# Patient Record
Sex: Female | Born: 1975 | Race: White | Hispanic: No | Marital: Married | Smoking: Never smoker
Health system: Southern US, Community
[De-identification: ages and names within clinical notes are randomized; demographics above are authoritative.]

## PROBLEM LIST (undated history)

## (undated) DIAGNOSIS — J45909 Unspecified asthma, uncomplicated: Secondary | ICD-10-CM

## (undated) DIAGNOSIS — E039 Hypothyroidism, unspecified: Secondary | ICD-10-CM

## (undated) DIAGNOSIS — J309 Allergic rhinitis, unspecified: Secondary | ICD-10-CM

## (undated) DIAGNOSIS — H04309 Unspecified dacryocystitis of unspecified lacrimal passage: Secondary | ICD-10-CM

## (undated) HISTORY — PX: NO PAST SURGERIES: SHX2092

## (undated) HISTORY — DX: Unspecified dacryocystitis of unspecified lacrimal passage: H04.309

## (undated) HISTORY — DX: Hypothyroidism, unspecified: E03.9

## (undated) HISTORY — DX: Allergic rhinitis, unspecified: J30.9

## (undated) HISTORY — DX: Unspecified asthma, uncomplicated: J45.909

---

## 2008-10-19 ENCOUNTER — Inpatient Hospital Stay (HOSPITAL_COMMUNITY): Admission: AD | Admit: 2008-10-19 | Discharge: 2008-10-21 | Payer: Self-pay | Admitting: Obstetrics & Gynecology

## 2008-10-19 ENCOUNTER — Encounter: Payer: Self-pay | Admitting: Internal Medicine

## 2008-10-19 LAB — CONVERTED CEMR LAB
HCT: 40.4 %
Hemoglobin: 13.9 g/dL
MCV: 94.3 fL
Platelets: 234 10*3/uL
RBC: 4.28 M/uL
RDW: 12.8 %
WBC: 10 10*3/uL

## 2008-10-20 ENCOUNTER — Encounter: Payer: Self-pay | Admitting: Internal Medicine

## 2008-10-20 LAB — CONVERTED CEMR LAB
HCT: 35.7 %
Hemoglobin: 12.3 g/dL
MCV: 95 fL
Platelets: 237 10*3/uL
RBC: 3.76 M/uL
RDW: 13 %
WBC: 17 10*3/uL

## 2008-11-19 LAB — CONVERTED CEMR LAB

## 2009-03-27 ENCOUNTER — Emergency Department (HOSPITAL_COMMUNITY): Admission: EM | Admit: 2009-03-27 | Discharge: 2009-03-27 | Payer: Self-pay | Admitting: Family Medicine

## 2009-04-16 ENCOUNTER — Encounter: Payer: Self-pay | Admitting: Internal Medicine

## 2009-06-12 ENCOUNTER — Ambulatory Visit: Payer: Self-pay | Admitting: Internal Medicine

## 2009-06-12 DIAGNOSIS — J309 Allergic rhinitis, unspecified: Secondary | ICD-10-CM | POA: Insufficient documentation

## 2009-06-12 DIAGNOSIS — E039 Hypothyroidism, unspecified: Secondary | ICD-10-CM | POA: Insufficient documentation

## 2009-06-12 DIAGNOSIS — J45909 Unspecified asthma, uncomplicated: Secondary | ICD-10-CM

## 2009-06-13 LAB — CONVERTED CEMR LAB: TSH: 0.19 microintl units/mL — ABNORMAL LOW (ref 0.35–5.50)

## 2009-07-21 ENCOUNTER — Ambulatory Visit: Payer: Self-pay | Admitting: Internal Medicine

## 2009-07-21 DIAGNOSIS — H04309 Unspecified dacryocystitis of unspecified lacrimal passage: Secondary | ICD-10-CM | POA: Insufficient documentation

## 2009-07-21 DIAGNOSIS — L255 Unspecified contact dermatitis due to plants, except food: Secondary | ICD-10-CM

## 2009-07-21 LAB — CONVERTED CEMR LAB: TSH: 6.93 u[IU]/mL — ABNORMAL HIGH

## 2009-09-08 ENCOUNTER — Ambulatory Visit: Payer: Self-pay | Admitting: Endocrinology

## 2009-09-08 LAB — CONVERTED CEMR LAB: TSH: 4.51 microintl units/mL (ref 0.35–5.50)

## 2009-11-02 ENCOUNTER — Telehealth: Payer: Self-pay | Admitting: Internal Medicine

## 2009-11-30 ENCOUNTER — Encounter: Payer: Self-pay | Admitting: Endocrinology

## 2009-12-02 ENCOUNTER — Telehealth: Payer: Self-pay | Admitting: Endocrinology

## 2009-12-02 ENCOUNTER — Encounter: Payer: Self-pay | Admitting: Endocrinology

## 2010-02-09 ENCOUNTER — Telehealth: Payer: Self-pay | Admitting: Internal Medicine

## 2010-03-21 NOTE — L&D Delivery Note (Signed)
Pt had an uneventful 1st stage.  When she began pushing the nursing staff believed that they palpated a mouth.  The position was confirmed as mentum anterior.  The risk and complication of this position was discussed with the patient and her husband.  She pushed for another 20 minutes.  FHTs remained reactive.  She had a SVD over a 2nd degree MLE.  Handed to NICU team. Apgars 9 and 9.  Placenta S/I 3vc.  Baby to NBN.  EBL-400cc. MLE was closed with 2-0 Vicryl.

## 2010-04-09 ENCOUNTER — Encounter: Payer: Self-pay | Admitting: Internal Medicine

## 2010-04-09 ENCOUNTER — Other Ambulatory Visit: Payer: Self-pay | Admitting: Internal Medicine

## 2010-04-09 ENCOUNTER — Ambulatory Visit
Admission: RE | Admit: 2010-04-09 | Discharge: 2010-04-09 | Payer: Self-pay | Source: Home / Self Care | Attending: Internal Medicine | Admitting: Internal Medicine

## 2010-04-09 DIAGNOSIS — R109 Unspecified abdominal pain: Secondary | ICD-10-CM | POA: Insufficient documentation

## 2010-04-09 LAB — CONVERTED CEMR LAB
Bilirubin Urine: NEGATIVE
Specific Gravity, Urine: <1.005
Urobilinogen, UA: 0.2
WBC Urine, dipstick: NEGATIVE
pH: 5

## 2010-04-09 LAB — BASIC METABOLIC PANEL
BUN: 10 mg/dL (ref 6–23)
CO2: 29 mEq/L (ref 19–32)
Calcium: 9.1 mg/dL (ref 8.4–10.5)
Chloride: 106 mEq/L (ref 96–112)
Creatinine, Ser: 0.7 mg/dL (ref 0.4–1.2)
GFR: 110.79 mL/min (ref >60.00)
Glucose, Bld: 80 mg/dL (ref 70–99)
Potassium: 4 mEq/L (ref 3.5–5.1)
Sodium: 142 mEq/L (ref 135–145)

## 2010-04-09 LAB — CBC WITH DIFFERENTIAL/PLATELET
Basophils Absolute: 0 10*3/uL (ref 0.0–0.1)
Basophils Relative: 0.4 % (ref 0.0–3.0)
Eosinophils Absolute: 0.1 10*3/uL (ref 0.0–0.7)
Eosinophils Relative: 1.8 % (ref 0.0–5.0)
HCT: 39.7 % (ref 36.0–46.0)
Hemoglobin: 13.7 g/dL (ref 12.0–15.0)
Lymphocytes Relative: 21.7 % (ref 12.0–46.0)
Lymphs Abs: 1.7 10*3/uL (ref 0.7–4.0)
MCHC: 34.6 g/dL (ref 30.0–36.0)
MCV: 91.5 fl (ref 78.0–100.0)
Monocytes Absolute: 0.6 10*3/uL (ref 0.1–1.0)
Monocytes Relative: 8.3 % (ref 3.0–12.0)
Neutro Abs: 5.2 10*3/uL (ref 1.4–7.7)
Neutrophils Relative %: 67.8 % (ref 43.0–77.0)
Platelets: 238 10*3/uL (ref 150.0–400.0)
RBC: 4.34 Mil/uL (ref 3.87–5.11)
RDW: 12.9 % (ref 11.5–14.6)
WBC: 7.7 10*3/uL (ref 4.5–10.5)

## 2010-04-09 LAB — TSH: TSH: 5.77 u[IU]/mL — ABNORMAL HIGH (ref 0.35–5.50)

## 2010-04-12 DIAGNOSIS — R109 Unspecified abdominal pain: Secondary | ICD-10-CM | POA: Insufficient documentation

## 2010-04-13 ENCOUNTER — Ambulatory Visit: Payer: Self-pay | Admitting: Internal Medicine

## 2010-04-13 ENCOUNTER — Encounter: Payer: Self-pay | Admitting: Internal Medicine

## 2010-04-14 ENCOUNTER — Telehealth: Payer: Self-pay | Admitting: Internal Medicine

## 2010-04-20 NOTE — Miscellaneous (Signed)
  Clinical Lists Changes  Medications: Removed medication of LEVOTHYROXINE SODIUM 25 MCG TABS (LEVOTHYROXINE SODIUM) 1 by mouth once daily except 2 tabs on Mon, Wed and Fri Added new medication of LEVOTHYROXINE SODIUM 50 MCG TABS (LEVOTHYROXINE SODIUM) 1 tab once daily - Signed Rx of LEVOTHYROXINE SODIUM 50 MCG TABS (LEVOTHYROXINE SODIUM) 1 tab once daily;  #30 x 5;  Signed;  Entered by: Minus Breeding MD;  Authorized by: Minus Breeding MD;  Method used: Electronically to [facility] # [address], [place], [place]  [address], Ph: [phone], Fax: [phone]    Prescriptions: LEVOTHYROXINE SODIUM 50 MCG TABS (LEVOTHYROXINE SODIUM) 1 tab once daily  #30 x 5   Entered and Authorized by:   Minus Breeding MD   Signed by:   Minus Breeding MD on 12/02/2009   Method used:   Electronically to        [facility] # [address] (retail)       [address]       [place], [place]  [address]       Ph: [phone]       Fax: [phone]   RxID:   [id number]

## 2010-04-20 NOTE — Assessment & Plan Note (Signed)
Summary: NEW / [facility] / #/ CD   Vital Signs:  Patient profile:   35 year old female Height:      70.5 inches (179.07 cm) Weight:      145.8 pounds (66.27 kg) BMI:     20.70 O2 Sat:      97 % on Room air Temp:     98.5 degrees F (36.94 degrees C) oral Pulse rate:   77 / minute BP sitting:   112 / 70  (left arm) Cuff size:   regular  Vitals Entered By: Orlan Leavens (June 12, 2009 3:18 PM)  O2 Flow:  Room air CC: New patient Is Patient Diabetic? No Pain Assessment Patient in pain? no        Primary Care Provider:  Newt Lukes MD  CC:  New patient.  History of Present Illness: new pt to me and our practice - here to est care -   1) hypothyroidism - reports compliance with ongoing medical treatment and no changes in medication dose or frequency. denies adverse side effects related to current therapy.   2) seasonal allg - "severe" since living in [place] last 3 years - takes antihistamine daily - occassional hives thoughhas been no skin allg in past few months  Preventive Screening-Counseling & Management  Alcohol-Tobacco     Alcohol drinks/day: <1     Smoking Status: quit  Caffeine-Diet-Exercise     Diet Counseling: not indicated; diet is assessed to be healthy     Does Patient Exercise: yes     Exercise Counseling: not indicated; exercise is adequate     Depression Counseling: not indicated; screening negative for depression  Clinical Review Panels:  Prevention   Last Pap Smear:  Interpretation/Result :Negative for intraepithelial Lesion or Malignancy.    (11/19/2008)  Immunizations   Last Tetanus Booster:  Historical (03/21/2009)  CBC   WBC:  17.0 (10/20/2008)   RBC:  3.76 (10/20/2008)   Hgb:  12.3 (10/20/2008)   Hct:  35.7 (10/20/2008)   Platelets:  237 (10/20/2008)   MCV  95.0 (10/20/2008)   RDW  13.0 (10/20/2008)   Current Medications (verified): 1)  Levothyroxine Sodium 50 Mcg Tabs (Levothyroxine Sodium) .... Take 1 Po Qd 2)  Claritin-D 24  Hour 10-240 Mg Xr24h-Tab (Loratadine-Pseudoephedrine) .... Take 1 By Mouth Qd 3)  Ortho Micronor 0.35 Mg Tabs (Norethindrone) .... 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    Physical Exam  General:  alert, well-developed, well-nourished, and cooperative to examination.   newborn dtr here Eyes:  vision grossly intact; pupils equal, round and reactive to light.  conjunctiva and lids normal.    Neck:  supple, full ROM, no masses, no thyromegaly; no thyroid nodules or tenderness. no JVD or carotid bruits.   Lungs:  normal respiratory effort, no intercostal retractions or use of accessory muscles; normal breath sounds bilaterally - no crackles and no wheezes.  Heart:  normal rate, regular rhythm, no murmur, and no rub. BLE without edema.    Impression & Recommendations:  Problem # 1:  HYPOTHYROIDISM (ICD-244.9)  Her updated medication list for this problem includes:    Levothyroxine Sodium 50 Mcg Tabs (Levothyroxine sodium) .Marland Kitchen... Take 1 po qd  Orders: TLB-TSH (Thyroid Stimulating Hormone) (84443-TSH)  Problem # 2:  ALLERGIC RHINITIS (ICD-477.9)  Her updated medication list for this problem includes:    Claritin 10 Mg Tabs (Loratadine) .Marland Kitchen... 1 by mouth once daily  Complete Medication List: 1)   Levothyroxine Sodium 50 Mcg Tabs (Levothyroxine sodium) .... Take 1 po qd 2)  Claritin 10 Mg Tabs (Loratadine) .Marland Kitchen.. 1 by mouth once daily 3)  Ortho Micronor 0.35 Mg Tabs (Norethindrone) .... Once daily  Patient Instructions: 1)  it was good to see you today. 2)  test(s) ordered today - your results will be posted on the phone tree for review in 48-72 hours from the time of test completion; call [phone] and enter your 9 digit MRN (listed above on this page, just below your name); if any changes need to be made or there are abnormal results, you will be contacted directly.  3)  Refills on levothyroixine will be sent to your pharmacy as discussed if labs are normal - 4)  Please schedule a follow-up appointment in 6-12 months, sooner if problems.    Pap Smear  Procedure date:  11/19/2008  Findings:      Interpretation/Result :Negative for intraepithelial Lesion or Malignancy.        Immunization History:  Tetanus/Td Immunization History:    Tetanus/Td:  historical (03/21/2009)

## 2010-04-20 NOTE — Assessment & Plan Note (Signed)
Summary: NEW ENDO PT-FLAG/DD--HYPOTHY-STC   Vital Signs:  Patient profile:   35 year old female Height:      70.5 inches (179.07 cm) Weight:      145 pounds (65.91 kg) BMI:     20.59 O2 Sat:      92 % on Room air Temp:     97.9 degrees F (36.61 degrees C) oral Pulse rate:   64 / minute Pulse rhythm:   regular BP sitting:   108 / 72  (left arm) Cuff size:   regular  Vitals Entered By: Brenton Grills MA (September 08, 2009 9:53 AM)  O2 Flow:  Room air CC: New pt endo/aj   Primary Provider:  Newt Lukes MD  CC:  New pt endo/aj.  History of Present Illness: pt is now 11 months postpartum.   she was dx'ed with hypothyroidism in 2006.  she had persistent satiety, fatigue, weight gain.  she started synthroid, with improvement in her sxs.  3 mos ago, her synthroid was reduced to 25 micrograms/day.  then, last month, it was increased back to: 25 micrograms/day, except for 2x25 micrograms/day, three times weekly, x last 7 weeks.   she now has few mos of slight intermittent bloating in the abdomen, and associated fatigue.  she has resumed breast-feeding recently.    Current Medications (verified): 1)  Levothyroxine Sodium 25 Mcg Tabs (Levothyroxine Sodium) .Marland Kitchen.. 1 By Mouth Once Daily Except 2 Tabs On Mon, Wed and Fri 2)  Claritin 10 Mg Tabs (Loratadine) .... Take 2 By Mouth Once Daily  Allergies (verified): No Known Drug Allergies  Past History:  Past Medical History: Last updated: 07/21/2009 Allergic rhinitis Asthma hypothyroid migraines  MD rooster: gyn -Kaplan ([place])    Family History: Reviewed history from 06/12/2009 and no changes required. Family History of Alcoholism/Addiction (other relative) Family History of Arthritis (grandparent) Family History Diabetes 1st degree relative parent, grandparent) Family History High cholesterol (parent, other relative) mother had uncertain type of thyroid problem as a teenager (resolved)  Social History: Reviewed history from  06/12/2009 and no changes required. 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  dry skin, syncope.  she gained 60 lbs with the pregnancy, and since then has re-lost 50 of that.  menses have just resumed.     Physical Exam  General:  normal appearance.   Head:  head: no deformity eyes: no periorbital swelling, no proptosis external nose and ears are normal mouth: no lesion seen Neck:  no goiter Lungs:  Clear to auscultation bilaterally. Normal respiratory effort.  Heart:  Regular rate and rhythm without murmurs or gallops noted. Normal S1,S2.   Abdomen:  abdomen is soft, nontender.  no hepatosplenomegaly.   not distended.  no hernia  Msk:  muscle bulk and strength are grossly normal.  no obvious joint swelling.  gait is normal and steady  Extremities:  no deformity no edema Neurologic:  cn 2-12 grossly intact.   readily moves all 4's.   sensation is intact to touch on the feet. Skin:  normal texture and temp.  no rash.  not diaphoretic  Cervical Nodes:  No significant adenopathy.  Psych:  Alert and cooperative; normal mood and affect; normal attention span and concentration.   Additional Exam:  today: FastTSH  4.51 uIU/mL                 0.35-5.50    Impression & Recommendations:  Problem # 1:  HYPOTHYROIDISM (ICD-244.9) well-replaced  Problem # 2:  postpartum state this can affect thyroid hormone requirement  Other Orders: TLB-TSH (Thyroid Stimulating Hormone) (19147-WGN) Consultation Level III (56213)  Patient Instructions: 1)  cc dr Dareen Piano 2)  blood tests are being ordered for you today.  please call [phone] to hear your test results. 3)  Please schedule a follow-up appointment in 6 months. 4)  if you feel significantly different  in the meantime, call sooner, and dr Felicity Coyer or i would be happy to recheck the blood test sooner.

## 2010-04-20 NOTE — Progress Notes (Signed)
Summary: synthroid  Phone Note Refill Request Message from:  Fax from Pharmacy on November 02, 2009 9:30 AM  Refills Requested: Medication #1:  LEVOTHYROXINE SODIUM 25 MCG TABS 1 by mouth once daily except 2 tabs on Mon  Method Requested: Electronic Initial call taken by: Orlan Leavens RMA,  November 02, 2009 9:30 AM    Prescriptions: LEVOTHYROXINE SODIUM 25 MCG TABS (LEVOTHYROXINE SODIUM) 1 by mouth once daily except 2 tabs on Mon, Wed and Fri  #50 x 10   Entered by:   Orlan Leavens RMA   Authorized by:   Newt Lukes MD   Signed by:   Orlan Leavens RMA on 11/02/2009   Method used:   Electronically to        [facility] # [id number] (retail)       [address]       [place], [place]  [address]       Ph: [phone]       Fax: [phone]   RxID:   [id number]

## 2010-04-20 NOTE — Progress Notes (Signed)
Summary: synthroid dosage increase  Phone Note Outgoing Call Call back at [facility] Phone [phone]   Call placed by: Brenton Grills MA,  December 02, 2009 10:41 AM Call placed to: Patient Summary of Call: Pt's Levothyroxine medication increased by MD. Left message on voicemail informing pt of increase and that new rx was sent to her pharmacy. Initial call taken by: Brenton Grills MA,  December 02, 2009 10:42 AM

## 2010-04-20 NOTE — Assessment & Plan Note (Signed)
Summary: follow up-lb   Vital Signs:  Patient profile:   35 year old female Height:      70.5 inches (179.07 cm) Weight:      145.12 pounds (65.96 kg) O2 Sat:      97 % on Room air Temp:     98.1 degrees F (36.72 degrees C) oral Pulse rate:   67 / minute BP sitting:   98 / 72  (right arm)  Vitals Entered By: Orlan Leavens (Jul 21, 2009 10:32 AM)  O2 Flow:  Room air CC: follow-up visit Is Patient Diabetic? No Pain Assessment Patient in pain? no        Primary Care Provider:  Newt Lukes MD  CC:  follow-up visit.  History of Present Illness: here for f/u  hypothyroid - dose dec 3/26 due to low TSH - denies weight/skin/bowel change, less fatigue, no depression  seasonal allg - has caused inflammation and redness on lower inner eyelid -  no fever or vision changes -  symptoms present>1 week but yesterday began to "drain" and now feels much better- using claritin daily for nasal allg, no eye drops -  ?poison ivy - husband just got steroid shot for same - onset after exposure outdoors with yard work 72h ago -  affects left lateral thigh and flank - itching "bad" but not spreading - not tender - poorly tol of steroids   Current Medications (verified): 1)  Levothyroxine Sodium 25 Mcg Tabs (Levothyroxine Sodium) .Marland Kitchen.. 1 By Mouth Once Daily 2)  Claritin 10 Mg Tabs (Loratadine) .... Take 2 By Mouth Once Daily  Allergies (verified): No Known Drug Allergies  Past History:  Past Medical History: Allergic rhinitis Asthma hypothyroid migraines  MD rooster: gyn -Kaplan ([place])    Review of Systems  The patient denies anorexia, fever, weight loss, and chest pain.    Physical Exam  General:  alert, well-developed, well-nourished, and cooperative to examination.   newborn dtr and spouse here Eyes:  crusting over surface and min inflammation of right tearduct region - unable to express any purulence - no erythema or edema of eyelids/lashes - vision grossly intact;  pupils equal, round and reactive to light.  conjunctiva and lids normal.    Lungs:  normal respiratory effort, no intercostal retractions or use of accessory muscles; normal breath sounds bilaterally - no crackles and no wheezes.    Heart:  normal rate, regular rhythm, no murmur, and no rub. BLE without edema.  Skin:  linear scratches with irritation c/w posion ivy exposure on left thigh and left flank   Impression & Recommendations:  Problem # 1:  HYPOTHYROIDISM (ICD-244.9)  dose dec 3/26 due to suppressed TSH - recheck now - adjust as needed Her updated medication list for this problem includes:    Levothyroxine Sodium 25 Mcg Tabs (Levothyroxine sodium) .Marland Kitchen... 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 Take 2 by mouth once daily  Discussed avoidance of triggers and symptomatic treatment.   Problem # 3:  DACRYOCYSTITIS (ICD-375.30) improved s/p psontaneous drainage in last 24h - instructed on warm compress and to apply gentle outward pressur e- no signs infx or need for abx - cont allg control   Complete Medication List: 1)  Levothyroxine Sodium 25 Mcg Tabs (Levothyroxine sodium) .Marland Kitchen.. 1 by mouth once daily 2)  Claritin 10 Mg Tabs (Loratadine) .... Take 2 by mouth once daily  Patient Instructions: 1)  it was good to see you today. 2)  test(s) ordered today - your results will be called to you.  3)  use steroid cream (like hydrocortisone) on body affected by poison ivy scratches as needed -  4)  keep warm compress on right eye as needed to help with drainage - if redness, swelling or fever, please call 5)  Please schedule a follow-up appointment in 4-6 months to monitor thyroid issues, sooner if problems.

## 2010-04-20 NOTE — Progress Notes (Signed)
Summary: Rx req  Phone Note Call from Patient Call back at Home Phone [phone]   Caller: Patient Summary of Call: Pt called stating she has been experiencing back spasms for a few days now. Pt is requesting Rx for muscle relaxer, please advise. Initial call taken by: Margaret Pyle, CMA,  February 09, 2010 10:43 AM  Follow-up for Phone Call        ok for flexeril as needed - done per emr Follow-up by: Corwin Levins MD,  February 10, 2010 1:15 PM  Additional Follow-up for Phone Call Additional follow up Details #1::        Pt advised via VM that requested Rx is at pharmacy, pt told to callback if needed. Additional Follow-up by: Margaret Pyle, CMA,  February 10, 2010 1:22 PM    New/Updated Medications: FLEXERIL 5 MG TABS (CYCLOBENZAPRINE HCL) 1 by mouth three times a day as needed Prescriptions: FLEXERIL 5 MG TABS (CYCLOBENZAPRINE HCL) 1 by mouth three times a day as needed  #40 x 0   Entered by:   Corwin Levins MD   Authorized by:   Newt Lukes MD   Signed by:   Corwin Levins MD on 02/10/2010   Method used:   Electronically to        [facility] # [address]* (retail)       [address]       [place], [place]  [address]       Ph: [phone]       Fax: [phone]   RxID:   [id number]

## 2010-04-22 NOTE — Progress Notes (Signed)
Summary: RESULTS CT  Phone Note Call from Patient Call back at Home Phone [phone]   Summary of Call: Pt had CT yesterday am and left vm req results.  Initial call taken by: Lamar Sprinkles, CMA,  April 14, 2010 2:42 PM  Follow-up for Phone Call        i have not seen results - our system or hosp EMR - please help me track this down - thanks! Follow-up by: Newt Lukes MD,  April 14, 2010 3:52 PM  Additional Follow-up for Phone Call Additional follow up Details #1::        Rose at CT faxed results, forwarded to VAL for review. Margaret Pyle, CMA  April 14, 2010 4:33 PM   Per VAL "tiny kidney stone on rt, should no be causing pain, no ovary or colon or abnormality to explain pain sxs-  f/u with GYN as planned for eval or ROV here".  left message on machine for pt to return my call. Additional Follow-up by: Margaret Pyle, CMA,  April 14, 2010 4:37 PM    Additional Follow-up for Phone Call Additional follow up Details #2::    Pt advised and will follow up with GYN this week. Follow-up by: Margaret Pyle, CMA,  April 15, 2010 8:49 AM

## 2010-04-22 NOTE — Assessment & Plan Note (Signed)
Summary: ABD PAIN /NWS   Vital Signs:  Patient profile:   35 year old female Height:      70.5 inches (179.07 cm) Weight:      145 pounds (65.91 kg) O2 Sat:      97 % on Room air Temp:     98.5 degrees F (36.94 degrees C) oral Pulse rate:   61 / minute BP sitting:   98 / 78  (left arm) Cuff size:   regular  Vitals Entered By: Orlan Leavens RMA (April 09, 2010 4:13 PM)  O2 Flow:  Room air CC: abdominal pain, Abdominal pain Is Patient Diabetic? No Pain Assessment Patient in pain? yes     Location: abdomen   Primary Care Provider:  Newt Lukes MD  CC:  abdominal pain and Abdominal pain.  History of Present Illness:  Abdominal Pain      This is a 35 year old woman who presents with Abdominal pain.  The symptoms began 2 weeks ago.  On a scale of 1 to 10, the intensity is described as a 5.  no history of this type of abd pain. "bad" period last week but continued suprapubic cramping spells.  The patient denies nausea, vomiting, diarrhea, constipation, and anorexia.  The location of the pain is suprapubic.  The pain is described as intermittent, sharp, and cramping in quality.  Associated symptoms include dysuria and dark urine.  The patient denies the following symptoms: fever, weight loss, chest pain, and missed menstrual period.  The pain is worse with food.  The pain is better with rest.    hypothyroid - dx 2008 - reports compliance with ongoing medical treatment and no changes in medication dose or frequency. denies adverse side effects related to current therapy.   Clinical Review Panels:  CBC   WBC:  17.0 (10/20/2008)   RBC:  3.76 (10/20/2008)   Hgb:  12.3 (10/20/2008)   Hct:  35.7 (10/20/2008)   Platelets:  237 (10/20/2008)   MCV  95.0 (10/20/2008)   RDW  13.0 (10/20/2008)   Current Medications (verified): 1)  Levothyroxine Sodium 50 Mcg Tabs (Levothyroxine Sodium) .Marland Kitchen.. 1 Tab Once Daily  Allergies (verified): No Known Drug Allergies  Past History:  Past  Medical History: Allergic rhinitis Asthma hypothyroid  migraines  MD roster: gyn -Kaplan ([facility])   endo - ellison  Review of Systems  The patient denies melena, hematochezia, severe indigestion/heartburn, hematuria, incontinence, genital sores, suspicious skin lesions, and depression.    Physical Exam  General:  alert, well-developed, well-nourished, and cooperative to examination.   Lungs:  normal respiratory effort, no intercostal retractions or use of accessory muscles; normal breath sounds bilaterally - no crackles and no wheezes.    Heart:  normal rate, regular rhythm, no murmur, and no rub. BLE without edema.  Abdomen:  soft, non-tender, normal bowel sounds, no distention; no masses and no appreciable hepatomegaly or splenomegaly.     Impression & Recommendations:  Problem # 1:  SUPRAPUBIC PAIN (ICD-789.09)  probable cystitis pain -hx and exam otherwise benign  Udip unremarkable but tc emperic for UTI and send for Ucx - also check labs The following medications were removed from the medication list:    Flexeril 5 Mg Tabs (Cyclobenzaprine hcl) .Marland Kitchen... 1 by mouth three times a day as needed  Orders: UA Dipstick w/o Micro (manual) (10932) T-Culture, Urine ([id number]) TLB-CBC Platelet - w/Differential (85025-CBCD) TLB-BMP (Basic Metabolic Panel-BMET) (80048-METABOL) Prescription Created Electronically [phone])  Discussed use of medications, application of heat  or cold, and exercises.   Problem # 2:  HYPOTHYROIDISM (ICD-244.9)  Her updated medication list for this problem includes:    Levothyroxine Sodium 50 Mcg Tabs (Levothyroxine sodium) .Marland Kitchen... 1 tab once daily   dose dec 3/26 due to suppressed TSH - recheck now - adjust as needed  Labs Reviewed: TSH: 4.51 (09/08/2009)     Orders: TLB-TSH (Thyroid Stimulating Hormone) (84443-TSH)  Complete Medication List: 1)  Levothyroxine Sodium 50 Mcg Tabs (Levothyroxine sodium) .Marland Kitchen.. 1 tab once daily 2)  Ciprofloxacin Hcl  500 Mg Tabs (Ciprofloxacin hcl) .Marland Kitchen.. 1 by mouth two times a day x 5 days  Patient Instructions: 1)  it was good to see you today. 2)  test(s) ordered today - your results will be called to you after review in 48-72 hours from the time of test completion; if any changes need to be made or there are abnormal results, you will be notified at that time 3)  cipro for possible urinary infection - your prescription has been electronically submitted to your pharmacy. Please take as directed. Contact our office if you believe you're having problems with the medication(s).  4)  Get plenty of rest, drink lots of clear liquids, and use Tylenol or Ibuprofen for fever and comfort. Return in 7-10 days if you're not better:sooner if you're feeling worse. Prescriptions: CIPROFLOXACIN HCL 500 MG TABS (CIPROFLOXACIN HCL) 1 by mouth two times a day x 5 days  #10 x 0   Entered and Authorized by:   Newt Lukes MD   Signed by:   Newt Lukes MD on 04/09/2010   Method used:   Electronically to        [facility] # [phone]* (retail)       [address]       [place], [place]  [address]       Ph: [phone]       Fax: [phone]   RxID:   [id number]    Orders Added: 1)  UA Dipstick w/o Micro (manual) [81002] 2)  T-Culture, Urine [[id number]] 3)  TLB-CBC Platelet - w/Differential [85025-CBCD] 4)  TLB-BMP (Basic Metabolic Panel-BMET) [80048-METABOL] 5)  TLB-TSH (Thyroid Stimulating Hormone) [84443-TSH] 6)  Est. Patient Level IV [16606] 7)  Prescription Created Electronically [phone]    Laboratory Results   Urine Tests    Routine Urinalysis   Color: lt. yellow Appearance: Clear Glucose: negative   (Normal Range: Negative) Bilirubin: negative   (Normal Range: Negative) Ketone: negative   (Normal Range: Negative) Spec. Gravity: <1.005   (Normal Range: 1.003-1.035) Blood: trace-intact   (Normal Range: Negative) pH: 5.0   (Normal Range: 5.0-8.0) Protein: negative    (Normal Range: Negative) Urobilinogen: 0.2   (Normal Range: 0-1) Nitrite: negative   (Normal Range: Negative) Leukocyte Esterace: negative   (Normal Range: Negative)

## 2010-04-23 ENCOUNTER — Telehealth: Payer: Self-pay | Admitting: Internal Medicine

## 2010-05-06 NOTE — Progress Notes (Signed)
Summary: tsh labs resutls  Phone Note Call from Patient Call back at Home Phone [phone]   Caller: [phone] Call For: Dr Felicity Coyer Summary of Call: Pt requesting TSH lab results. Pt states she has left a few messages in medical records but has not rec'd a call back Initial call taken by: Verdell Face,  April 23, 2010 2:42 PM  Follow-up for Phone Call        pt was notified on 1/23 (see append on lab note):   needs to increase thyroid a little due to high TSH - take daily except on sunday  -may send erx for thyroid meds as needed  other labs ok - Newt Lukes MD  April 11, 2010 1:30 PM   Called pt concerning labs & urine test. Pt would like new rx's sent to pharmacy, but pt states she is actually worse today. Pain has not gone away. Having to leave work this morning, she states she has develop diarrhea now, pain is worse. Want to know what else she can do. Pls advise....04/12/10@12 :11pm/LMB  erx were done - please review above with pt again- thanks Follow-up by: Newt Lukes MD,  April 23, 2010 4:08 PM  Additional Follow-up for Phone Call Additional follow up Details #1::        Pt called requesting to have values of TSH for the last year, pt is going to see and Endocrinologist, I called pt and advised of values and dose changes. Additional Follow-up by: Margaret Pyle, CMA,  April 26, 2010 11:02 AM

## 2010-06-06 LAB — POCT URINALYSIS DIP (DEVICE)
Protein, ur: NEGATIVE mg/dL
Urobilinogen, UA: 0.2 mg/dL (ref 0.0–1.0)
pH: 6.5 (ref 5.0–8.0)

## 2010-06-06 LAB — POCT PREGNANCY, URINE: Preg Test, Ur: NEGATIVE

## 2010-06-21 LAB — HEPATITIS B SURFACE ANTIGEN: Hepatitis B Surface Ag: NEGATIVE

## 2010-06-21 LAB — ANTIBODY SCREEN: Antibody Screen: NEGATIVE

## 2010-06-21 LAB — ABO/RH

## 2010-06-21 LAB — HIV ANTIBODY (ROUTINE TESTING W REFLEX): HIV: NONREACTIVE

## 2010-06-21 LAB — RUBELLA ANTIBODY, IGM: Rubella: IMMUNE

## 2010-06-26 LAB — CBC
HCT: 35.7 % — ABNORMAL LOW (ref 36.0–46.0)
Hemoglobin: 12.3 g/dL (ref 12.0–15.0)
Hemoglobin: 13.9 g/dL (ref 12.0–15.0)
MCHC: 34.5 g/dL (ref 30.0–36.0)
MCV: 95 fL (ref 78.0–100.0)
RBC: 3.76 MIL/uL — ABNORMAL LOW (ref 3.87–5.11)
RBC: 4.28 MIL/uL (ref 3.87–5.11)
WBC: 17 10*3/uL — ABNORMAL HIGH (ref 4.0–10.5)

## 2010-09-17 ENCOUNTER — Other Ambulatory Visit: Payer: Self-pay | Admitting: Internal Medicine

## 2010-09-20 ENCOUNTER — Encounter: Payer: Self-pay | Admitting: Internal Medicine

## 2010-10-03 ENCOUNTER — Encounter (HOSPITAL_COMMUNITY): Payer: Self-pay

## 2010-10-03 ENCOUNTER — Inpatient Hospital Stay (HOSPITAL_COMMUNITY)
Admission: AD | Admit: 2010-10-03 | Discharge: 2010-10-03 | Disposition: A | Discharge status: Home / Self Care | Payer: 59 | Source: Ambulatory Visit | Attending: Obstetrics and Gynecology | Admitting: Obstetrics and Gynecology

## 2010-10-03 ENCOUNTER — Inpatient Hospital Stay (HOSPITAL_COMMUNITY): Payer: 59

## 2010-10-03 DIAGNOSIS — O209 Hemorrhage in early pregnancy, unspecified: Secondary | ICD-10-CM | POA: Insufficient documentation

## 2010-10-03 DIAGNOSIS — O2390 Unspecified genitourinary tract infection in pregnancy, unspecified trimester: Secondary | ICD-10-CM

## 2010-10-03 DIAGNOSIS — B3731 Acute candidiasis of vulva and vagina: Secondary | ICD-10-CM | POA: Insufficient documentation

## 2010-10-03 DIAGNOSIS — B373 Candidiasis of vulva and vagina: Secondary | ICD-10-CM

## 2010-10-03 LAB — URINALYSIS, ROUTINE W REFLEX MICROSCOPIC
Bilirubin Urine: NEGATIVE
Glucose, UA: NEGATIVE mg/dL
Ketones, ur: NEGATIVE mg/dL
pH: 7 (ref 5.0–8.0)

## 2010-10-03 LAB — URINE MICROSCOPIC-ADD ON

## 2010-10-03 LAB — WET PREP, GENITAL
Clue Cells Wet Prep HPF POC: NONE SEEN
Trich, Wet Prep: NONE SEEN

## 2010-10-03 NOTE — Progress Notes (Signed)
EFM removed, to u/s.

## 2010-10-03 NOTE — ED Provider Notes (Signed)
History     Chief Complaint  Patient presents with  . Vaginal Bleeding    small amount dark in color  . Back Pain   HPI  Noticed dark blood when wiping this morning.  Has had some back pain.  Has periodic pain that she thought was just normal in pregnancy.  Currently 26 weeks.  OB History    Grav Para Term Preterm Abortions TAB SAB Ect Mult Living   4 1 1  2  2   1       Past Medical History  Diagnosis Date  . Abdominal pain, unspecified site 04/12/2010  . ALLERGIC RHINITIS 06/12/2009  . ASTHMA 06/12/2009  . 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 HYPOTHYROIDISM 06/12/2009  . 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 SUPRAPUBIC PAIN 04/09/2010    No past surgical history on file.  Family History  Problem Relation Age of Onset  . Alcohol abuse Other   . Arthritis Other     grandparent  . Diabetes Other     parent, grandparent  . Hyperlipidemia Other     parent, other relative    History  Substance Use Topics  . Smoking status: Never Smoker   . Smokeless tobacco: Not on file   Comment: Married,lives with spouse and newborn dtr  . Alcohol Use: No    Allergies: Allergies not on file  Prescriptions prior to admission  Medication Sig Dispense Refill  . fish oil-omega-3 fatty acids 1000 MG capsule Take 2 g by mouth daily.        Marland Kitchen FOLIC ACID PO Take by mouth.        . levothyroxine (SYNTHROID, LEVOTHROID) 50 MCG tablet Take 50 mcg by mouth daily. Except on Sunday take       . levothyroxine (SYNTHROID, LEVOTHROID) 75 MCG tablet TAKE 1 TABLET BY MOUTH DAILY ON SUNDAYS  30 tablet  1  . prenatal vitamin w/FE, FA (PRENATAL 1 + 1) 27-1 MG TABS Take 1 tablet by mouth daily.          Review of Systems  Gastrointestinal: Negative for abdominal pain.  Genitourinary: Negative for dysuria.  Musculoskeletal: Positive for back pain.   Physical Exam   Blood pressure 117/78, pulse 81, temperature 98.2 F (36.8 C), temperature source Oral, resp. rate 18, height 5\' 10"  (1.778 m), weight 171  lb (77.565 kg).  Physical Exam  Vitals reviewed. Constitutional: She is oriented to person, place, and time. She appears well-developed and well-nourished. No distress.  HENT:  Head: Normocephalic.  Eyes: EOM are normal.  Neck: Neck supple.  GI: Soft. There is no tenderness.       No contractions seen on monitor.  FHT baseline 145  Genitourinary:       Speculum exam, cervix appears closed.  Small amount of discharge.  Inner vulva and vagina appear pink.  Some very old tan-brown blood noted.  No active bleeding.  Bimanual exam - cervix soft and closed.  Presenting part not in pelvis.  Wet prep done.  Musculoskeletal: Normal range of motion.  Neurological: She is alert and oriented to person, place, and time.  Skin: Skin is warm and dry.  Psychiatric: She has a normal mood and affect.    MAU Course  Procedures Ultrasound - preliminary results reviewed with Dr. Henderson Cloud - Cervical length 3.1, AFI 48%, no evidence of abruption or previa.  MDM  Consult with Dr. Henderson Cloud prior to pelvic exam.  Wet prep - few WBC Client did report vaginal itching. No contractions.  No active bleeding at present.   Assessment:  Probable yeast infection in pregnancy  Plan:  7 day OTC yeast treatment per Dr. Henderson Cloud Call your doctor with any questions or concerns.     Nolene Bernheim, NP 10/03/10 1446

## 2010-10-03 NOTE — Progress Notes (Signed)
By Lilyan Punt NP  10/03/10 1343  Pelvic Exam  Procedure explained to patient Yes  Witnesses present? Yes  Cultures obtained and sent? Yes  How tolerated? Tolerated well

## 2010-10-03 NOTE — Progress Notes (Signed)
Small amount of bleeding this morning dark in color, no history of placenta previa, intercourse on Friday, pressure in lower abdomen, yesterday had hot flash with some SOB

## 2010-11-11 ENCOUNTER — Other Ambulatory Visit: Payer: Self-pay | Admitting: Internal Medicine

## 2010-12-20 ENCOUNTER — Inpatient Hospital Stay (HOSPITAL_COMMUNITY)
Admission: AD | Admit: 2010-12-20 | Discharge: 2010-12-22 | DRG: 775 | Disposition: A | Discharge status: Home / Self Care | Payer: 59 | Source: Ambulatory Visit | Attending: Obstetrics and Gynecology | Admitting: Obstetrics and Gynecology

## 2010-12-20 ENCOUNTER — Encounter (HOSPITAL_COMMUNITY): Payer: Self-pay | Admitting: Anesthesiology

## 2010-12-20 ENCOUNTER — Inpatient Hospital Stay (HOSPITAL_COMMUNITY): Payer: 59 | Admitting: Anesthesiology

## 2010-12-20 ENCOUNTER — Encounter (HOSPITAL_COMMUNITY): Payer: Self-pay | Admitting: *Deleted

## 2010-12-20 LAB — CBC
MCH: 30.9 pg (ref 26.0–34.0)
MCHC: 34.9 g/dL (ref 30.0–36.0)
RDW: 13.6 % (ref 11.5–15.5)

## 2010-12-20 LAB — RPR: RPR Ser Ql: NONREACTIVE

## 2010-12-20 LAB — ABO/RH: ABO/RH(D): B POS

## 2010-12-20 MED ORDER — BENZOCAINE-MENTHOL 20-0.5 % EX AERO: 1.0000 "application " | INHALATION_SPRAY | CUTANEOUS | Status: DC | PRN

## 2010-12-20 MED ORDER — FENTANYL 2.5 MCG/ML BUPIVACAINE 1/10 % EPIDURAL INFUSION (WH - ANES)
14.0000 mL/h | INTRAMUSCULAR | Status: DC
  Administered 2010-12-20 (×3): 14 mL/h via EPIDURAL
  Filled 2010-12-20 (×3): qty 60

## 2010-12-20 MED ORDER — MEASLES, MUMPS & RUBELLA VAC ~~LOC~~ INJ
0.5000 mL | INJECTION | Freq: Once | SUBCUTANEOUS | Status: DC
  Filled 2010-12-20: qty 0.5

## 2010-12-20 MED ORDER — OXYCODONE-ACETAMINOPHEN 5-325 MG PO TABS: 1.0000 | ORAL_TABLET | ORAL | Status: DC | PRN

## 2010-12-20 MED ORDER — ONDANSETRON HCL 4 MG/2ML IJ SOLN: 4.0000 mg | INTRAMUSCULAR | Status: DC | PRN

## 2010-12-20 MED ORDER — CITRIC ACID-SODIUM CITRATE 334-500 MG/5ML PO SOLN
30.0000 mL | ORAL | Status: DC | PRN
  Filled 2010-12-20 (×2): qty 15

## 2010-12-20 MED ORDER — EPHEDRINE 5 MG/ML INJ
10.0000 mg | INTRAVENOUS | Status: DC | PRN
  Filled 2010-12-20 (×2): qty 4

## 2010-12-20 MED ORDER — EPHEDRINE 5 MG/ML INJ
10.0000 mg | INTRAVENOUS | Status: AC | PRN
  Administered 2010-12-20 (×2): 10 mg via INTRAVENOUS
  Filled 2010-12-20 (×2): qty 4

## 2010-12-20 MED ORDER — IBUPROFEN 600 MG PO TABS: 600.0000 mg | ORAL_TABLET | Freq: Four times a day (QID) | ORAL | Status: DC | PRN

## 2010-12-20 MED ORDER — DIBUCAINE 1 % RE OINT
1.0000 "application " | TOPICAL_OINTMENT | RECTAL | Status: DC | PRN
  Administered 2010-12-20: 1 via RECTAL
  Filled 2010-12-20: qty 28

## 2010-12-20 MED ORDER — IBUPROFEN 600 MG PO TABS
600.0000 mg | ORAL_TABLET | Freq: Four times a day (QID) | ORAL | Status: DC
  Administered 2010-12-20 – 2010-12-22 (×7): 600 mg via ORAL
  Filled 2010-12-20 (×7): qty 1

## 2010-12-20 MED ORDER — PHENYLEPHRINE 40 MCG/ML (10ML) SYRINGE FOR IV PUSH (FOR BLOOD PRESSURE SUPPORT)
80.0000 ug | PREFILLED_SYRINGE | INTRAVENOUS | Status: DC | PRN
  Filled 2010-12-20 (×2): qty 5

## 2010-12-20 MED ORDER — DIPHENHYDRAMINE HCL 50 MG/ML IJ SOLN: 12.5000 mg | INTRAMUSCULAR | Status: DC | PRN

## 2010-12-20 MED ORDER — LACTATED RINGERS IV SOLN
500.0000 mL | INTRAVENOUS | Status: DC | PRN
  Administered 2010-12-20: 1000 mL via INTRAVENOUS

## 2010-12-20 MED ORDER — PHENYLEPHRINE 40 MCG/ML (10ML) SYRINGE FOR IV PUSH (FOR BLOOD PRESSURE SUPPORT)
80.0000 ug | PREFILLED_SYRINGE | INTRAVENOUS | Status: DC | PRN
  Filled 2010-12-20: qty 5

## 2010-12-20 MED ORDER — BENZOCAINE-MENTHOL 20-0.5 % EX AERO
INHALATION_SPRAY | CUTANEOUS | Status: AC
  Administered 2010-12-20: 14:00:00
  Filled 2010-12-20: qty 56

## 2010-12-20 MED ORDER — FLEET ENEMA 7-19 GM/118ML RE ENEM: 1.0000 | ENEMA | RECTAL | Status: DC | PRN

## 2010-12-20 MED ORDER — SIMETHICONE 80 MG PO CHEW
80.0000 mg | CHEWABLE_TABLET | ORAL | Status: DC | PRN
  Administered 2010-12-21: 80 mg via ORAL

## 2010-12-20 MED ORDER — LACTATED RINGERS IV SOLN
500.0000 mL | Freq: Once | INTRAVENOUS | Status: AC
  Administered 2010-12-20: 500 mL via INTRAVENOUS

## 2010-12-20 MED ORDER — LEVOTHYROXINE SODIUM 50 MCG PO TABS
50.0000 ug | ORAL_TABLET | ORAL | Status: DC
  Filled 2010-12-20 (×2): qty 1

## 2010-12-20 MED ORDER — ONDANSETRON HCL 4 MG/2ML IJ SOLN
4.0000 mg | Freq: Four times a day (QID) | INTRAMUSCULAR | Status: DC | PRN
  Administered 2010-12-20 (×2): 4 mg via INTRAVENOUS
  Filled 2010-12-20 (×2): qty 2

## 2010-12-20 MED ORDER — LACTATED RINGERS IV SOLN
INTRAVENOUS | Status: DC
  Administered 2010-12-20: 125 mL/h via INTRAVENOUS

## 2010-12-20 MED ORDER — LIDOCAINE HCL 1.5 % IJ SOLN
INTRAMUSCULAR | Status: DC | PRN
  Administered 2010-12-20 (×2): 5 mL via INTRADERMAL

## 2010-12-20 MED ORDER — OXYCODONE-ACETAMINOPHEN 5-325 MG PO TABS: 2.0000 | ORAL_TABLET | ORAL | Status: DC | PRN

## 2010-12-20 MED ORDER — ZOLPIDEM TARTRATE 5 MG PO TABS: 5.0000 mg | ORAL_TABLET | Freq: Every evening | ORAL | Status: DC | PRN

## 2010-12-20 MED ORDER — TETANUS-DIPHTH-ACELL PERTUSSIS 5-2.5-18.5 LF-MCG/0.5 IM SUSP: 0.5000 mL | Freq: Once | INTRAMUSCULAR | Status: DC

## 2010-12-20 MED ORDER — WITCH HAZEL-GLYCERIN EX PADS
1.0000 "application " | MEDICATED_PAD | CUTANEOUS | Status: DC | PRN
  Administered 2010-12-20: 1 via TOPICAL

## 2010-12-20 MED ORDER — OXYTOCIN 20 UNITS IN LACTATED RINGERS INFUSION - SIMPLE
125.0000 mL/h | Freq: Once | INTRAVENOUS | Status: AC
  Administered 2010-12-20: 999 mL/h via INTRAVENOUS

## 2010-12-20 MED ORDER — OXYTOCIN BOLUS FROM INFUSION
500.0000 mL | Freq: Once | INTRAVENOUS | Status: DC
  Filled 2010-12-20: qty 1000
  Filled 2010-12-20: qty 500
  Filled 2010-12-20: qty 1000

## 2010-12-20 MED ORDER — PRENATAL PLUS 27-1 MG PO TABS
1.0000 | ORAL_TABLET | Freq: Every day | ORAL | Status: DC
  Administered 2010-12-21 – 2010-12-22 (×2): 1 via ORAL
  Filled 2010-12-20 (×2): qty 1

## 2010-12-20 MED ORDER — ONDANSETRON HCL 4 MG PO TABS: 4.0000 mg | ORAL_TABLET | ORAL | Status: DC | PRN

## 2010-12-20 MED ORDER — ACETAMINOPHEN 325 MG PO TABS
650.0000 mg | ORAL_TABLET | ORAL | Status: DC | PRN
  Administered 2010-12-20: 650 mg via ORAL
  Filled 2010-12-20: qty 2

## 2010-12-20 MED ORDER — LIDOCAINE HCL (PF) 1 % IJ SOLN
30.0000 mL | INTRAMUSCULAR | Status: DC | PRN
  Filled 2010-12-20 (×3): qty 30

## 2010-12-20 NOTE — Progress Notes (Signed)
Dr. Dareen Piano at bedside and reviewing FHR tracing and completed SVE and confirmed vertex/face presentation.  Orders received to allow pt to continue to push.  Will continue to monitor.

## 2010-12-20 NOTE — Anesthesia Procedure Notes (Signed)

## 2010-12-20 NOTE — Progress Notes (Signed)
Dr. Claiborne Billings notified of pt status, FHR, UC pattern, and SVE. Will continue to monitor.

## 2010-12-20 NOTE — Progress Notes (Signed)
Dr. Dareen Piano notified of pt status, SVE, FHR, UC pattern, and questionable vertex/face presentation, and pt pushing with each UC.  Dr. Dareen Piano states he would be in soon to assess presentation.  Will continue to monitor.

## 2010-12-20 NOTE — Progress Notes (Signed)
Dr. Claiborne Billings notified of pt status, FHR, UC pattern and SVE.

## 2010-12-20 NOTE — H&P (Addendum)
35 y.o. @37 .5  G4P1021 comes in c/o LOF and ctx since around 2100.  Otherwise has good fetal movement and no bleeding.  Pt's last exam in office was 3cm.  On exam in MAU pt was contracting regularly q2 and had progressed to 5.5cm.  Per MAU exam, no evidence of ROM.  Past Medical History  Diagnosis Date  . Abdominal pain, unspecified site 04/12/2010  . ALLERGIC RHINITIS 06/12/2009  . ASTHMA 06/12/2009  . 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 HYPOTHYROIDISM 06/12/2009  . 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 SUPRAPUBIC PAIN 04/09/2010   History reviewed. No pertinent past surgical history.  OB History    Grav Para Term Preterm Abortions TAB SAB Ect Mult Living   4 1 1  2  2   1      # Outc Date GA Lbr Len/2nd Wgt Sex Del Anes PTL Lv   1 TRM            2 SAB            3 SAB            4 CUR               History   Social History  . Marital Status: Married    Spouse Name: N/A    Number of Children: N/A  . Years of Education: N/A   Occupational History  . Not on file.   Social History Main Topics  . Smoking status: Never Smoker   . Smokeless tobacco: Not on file   Comment: Married,lives with spouse and newborn dtr  . Alcohol Use: No  . Drug Use: No  . Sexually Active: Yes   Other Topics Concern  . Not on file   Social History Narrative  . No narrative on file   Fioricet   Prenatal Course:  Uncomplicated, marked only by treated hypothyroidism and h/o asthma that was stable without meds throughout the pregnancy.  Filed Vitals:   12/20/10 0738  BP: 121/68  Pulse: 78  Temp: 97.8 F (36.6 C)  Resp: 20     Lungs/Cor:  NAD Abdomen:  soft, gravid Ex:  no cords, erythema SVE:  Currently 9/100/1 with small bulging bag FHTs:  130 mod, good STV, NST R Toco:  q2-5   A/P   Admit to L&D for labor Epidural placed AROM'd at last exam Boy- desires circ, EFW 6# GBS Neg per pt  Kayla Wagner

## 2010-12-20 NOTE — Anesthesia Preprocedure Evaluation (Addendum)
Anesthesia Evaluation  Name, MR# and DOB Patient awake  General Assessment Comment  Reviewed: Allergy & Precautions, H&P , Patient's Chart, lab work & pertinent test results  Airway Mallampati: II TM Distance: >3 FB Neck ROM: full    Dental No notable dental hx.    Pulmonary asthma  clear to auscultation  Pulmonary exam normal       Cardiovascular regular Normal    Neuro/Psych Negative Neurological ROS  Negative Psych ROS   GI/Hepatic negative GI ROS Neg liver ROS    Endo/Other  Negative Endocrine ROSHypothyroidism   Renal/GU negative Renal ROS     Musculoskeletal   Abdominal   Peds  Hematology negative hematology ROS (+)   Anesthesia Other Findings   Reproductive/Obstetrics (+) Pregnancy                          Anesthesia Physical Anesthesia Plan  ASA: II  Anesthesia Plan: Epidural   Post-op Pain Management:    Induction:   Airway Management Planned:   Additional Equipment:   Intra-op Plan:   Post-operative Plan:   Informed Consent: I have reviewed the patients History and Physical, chart, labs and discussed the procedure including the risks, benefits and alternatives for the proposed anesthesia with the patient or authorized representative who has indicated his/her understanding and acceptance.     Plan Discussed with:   Anesthesia Plan Comments:         Anesthesia Quick Evaluation

## 2010-12-21 LAB — CBC
HCT: 30.7 % — ABNORMAL LOW (ref 36.0–46.0)
Platelets: 195 10*3/uL (ref 150–400)
RDW: 13.8 % (ref 11.5–15.5)
WBC: 9.3 10*3/uL (ref 4.0–10.5)

## 2010-12-21 NOTE — Progress Notes (Signed)
Post Partum Day 1 Subjective: no complaints  Objective: Blood pressure 98/57, pulse 59, temperature 98.1 F (36.7 C), temperature source Oral, resp. rate 17, height 5\' 10"  (1.778 m), weight 77.565 kg (171 lb), SpO2 100.00%, unknown if currently breastfeeding.  Physical Exam:  General: alert Lochia: appropriate Uterine Fundus: firm   Basename 12/21/10 0545 12/20/10 0150  HGB 10.4* 12.9  HCT 30.7* 37.0    Assessment/Plan: Plan for discharge tomorrow   LOS: 1 day   Jaydi Bray D 12/21/2010, 9:12 AM

## 2010-12-22 NOTE — Anesthesia Postprocedure Evaluation (Signed)
  Anesthesia Post Note  Patient: Kayla Wagner  Procedure(s) Performed: * No procedures listed *  Anesthesia type: Epidural  Patient location: Mother/Baby  Post pain: Pain level controlled  Post assessment: Post-op Vital signs reviewed  Last Vitals:  Filed Vitals:   12/21/10 2132  BP: 97/61  Pulse: 54  Temp: 98.2 F (36.8 C)  Resp: 18    Post vital signs: Reviewed  Level of consciousness: awake  Complications: No apparent anesthesia complications

## 2010-12-22 NOTE — Progress Notes (Signed)
Patient is eating, ambulating, voiding.  Pain control is good.  Filed Vitals:   12/21/10 1351 12/21/10 2132 12/22/10 0542 12/22/10 0545  BP: 94/52 97/61 95/58  125/69  Pulse: 53 54 50 79  Temp: 98.3 F (36.8 C) 98.2 F (36.8 C) 98.1 F (36.7 C) 98.4 F (36.9 C)  TempSrc: Oral Oral Oral Oral  Resp: 18 18 17 18   Height:      Weight:      SpO2:        Fundus firm Perineum without swelling.  Lab Results  Component Value Date   WBC 9.3 12/21/2010   HGB 10.4* 12/21/2010   HCT 30.7* 12/21/2010   MCV 90.6 12/21/2010   PLT 195 12/21/2010    --/--/B POS (10/01 0150)/RI  A/P Post partum day 2.  Routine care.  Expect d/c per plan.    Emmalie Haigh A

## 2010-12-22 NOTE — Anesthesia Postprocedure Evaluation (Signed)
  Anesthesia Post-op Note  Patient: Kayla Wagner  Procedure(s) Performed: * No procedures listed *  Patient Location: Mother/Baby  Anesthesia Type: Epidural  Level of Consciousness: awake, alert , oriented and patient cooperative  Airway and Oxygen Therapy: Patient Spontanous Breathing  Post-op Pain: none  Post-op Assessment: Post-op Vital signs reviewed, Patient's Cardiovascular Status Stable, Respiratory Function Stable, Patent Airway, No signs of Nausea or vomiting, Adequate PO intake and Pain level controlled  Post-op Vital Signs: Reviewed and stable  Complications: No apparent anesthesia complications

## 2010-12-22 NOTE — Addendum Note (Signed)
Addendum  created 12/22/10 1033 by Suella Grove   Modules edited:Charges VN, Notes Section

## 2010-12-22 NOTE — Discharge Summary (Signed)
Obstetric Discharge Summary Reason for Admission: onset of labor Prenatal Procedures: none Intrapartum Procedures: spontaneous vaginal delivery Postpartum Procedures: none Complications-Operative and Postpartum: 2 degree perineal laceration Hemoglobin  Date Value Range Status  12/21/2010 10.4* 12.0-15.0 (g/dL) Final     DELTA CHECK NOTED     REPEATED TO VERIFY     HCT  Date Value Range Status  12/21/2010 30.7* 36.0-46.0 (%) Final    Discharge Diagnoses: Term Pregnancy-delivered  Discharge Information: Date: 12/22/2010 Activity: pelvic rest Diet: routine Medications: Ibuprofen Condition: stable Instructions: refer to practice specific booklet Discharge to: home Follow-up Information    Follow up with ANDERSON,MARK E. Make an appointment in 4 weeks.   Contact information:   [address] [place] [address] [phone]          Newborn Data: Live born female  Birth Weight: 7 lb 3.3 oz (3269 g) APGAR: 9, 9  Home with mother.  Kayla Wagner A 12/22/2010, 6:59 AM

## 2011-01-07 ENCOUNTER — Other Ambulatory Visit: Payer: Self-pay | Admitting: Internal Medicine

## 2011-02-22 ENCOUNTER — Encounter: Payer: Self-pay | Admitting: Internal Medicine

## 2011-02-22 ENCOUNTER — Ambulatory Visit (INDEPENDENT_AMBULATORY_CARE_PROVIDER_SITE_OTHER): Payer: 59 | Admitting: Internal Medicine

## 2011-02-22 ENCOUNTER — Other Ambulatory Visit (INDEPENDENT_AMBULATORY_CARE_PROVIDER_SITE_OTHER): Payer: 59

## 2011-02-22 VITALS — BP 100/70 | HR 66 | Temp 98.3°F | Wt 161.0 lb

## 2011-02-22 DIAGNOSIS — E039 Hypothyroidism, unspecified: Secondary | ICD-10-CM

## 2011-02-22 DIAGNOSIS — R42 Dizziness and giddiness: Secondary | ICD-10-CM

## 2011-02-22 DIAGNOSIS — R5383 Other fatigue: Secondary | ICD-10-CM

## 2011-02-22 LAB — BASIC METABOLIC PANEL
CO2: 26 mEq/L (ref 19–32)
Calcium: 9.1 mg/dL (ref 8.4–10.5)
Chloride: 108 mEq/L (ref 96–112)
Creatinine, Ser: 0.9 mg/dL (ref 0.4–1.2)
Glucose, Bld: 46 mg/dL — CL (ref 70–99)

## 2011-02-22 LAB — HEPATIC FUNCTION PANEL
AST: 30 U/L (ref 0–37)
Alkaline Phosphatase: 66 U/L (ref 39–117)
Bilirubin, Direct: 0.1 mg/dL (ref 0.0–0.3)
Total Bilirubin: 0.7 mg/dL (ref 0.3–1.2)

## 2011-02-22 LAB — CBC WITH DIFFERENTIAL/PLATELET
Basophils Absolute: 0 10*3/uL (ref 0.0–0.1)
Eosinophils Relative: 3.9 % (ref 0.0–5.0)
HCT: 39.4 % (ref 36.0–46.0)
Lymphocytes Relative: 37.3 % (ref 12.0–46.0)
Monocytes Relative: 9.2 % (ref 3.0–12.0)
Neutrophils Relative %: 48.9 % (ref 43.0–77.0)
Platelets: 237 10*3/uL (ref 150.0–400.0)
WBC: 4.9 10*3/uL (ref 4.5–10.5)

## 2011-02-22 LAB — TSH: TSH: 0.96 u[IU]/mL (ref 0.35–5.50)

## 2011-02-22 LAB — CORTISOL: Cortisol, Plasma: 10.6 ug/dL

## 2011-02-22 NOTE — Progress Notes (Signed)
  Subjective:    Patient ID: Kayla Wagner, female    DOB: October 05, 1975, 35 y.o.   MRN: [id number]  HPI  complains of dizzy sensation Onset 3-4 months ago prior to end of pregnancy October 2012. Describes as room spinning sensation, intermittent symptoms Not precipitated by position change or head movement Feels symptoms associated with lower than normal blood pressure including low blood pressure during delivery Question cortisol deficiency as new salt cravings since end of pregnancy No headache, no peripheral numbness or weakness, no falls No vision change, chest pain, shortness of breath or palpitations  Hypothyroid -no dose changes since completion of pregnancy. Due for lab recheck today  Question postpartum depression. "I just don't feel happy". Denies SI/HI. No prior history of personal depression or anxiety. Manifest with hypersomnia and poor sleep, extreme fatigue and irritability.   Past Medical History  Diagnosis Date  . ALLERGIC RHINITIS   . ASTHMA   . DACRYOCYSTITIS   . HYPOTHYROIDISM 2008 dx    Review of Systems  Constitutional: Positive for fatigue. Negative for fever.  Eyes: Negative for pain and visual disturbance.  Neurological: Negative for facial asymmetry and headaches.       Objective:   Physical Exam BP 100/70  Pulse 66  Temp(Src) 98.3 F (36.8 C) (Oral)  Wt 161 lb (73.029 kg)  SpO2 98% Wt Readings from Last 3 Encounters:  02/22/11 161 lb (73.029 kg)  12/20/10 171 lb (77.565 kg)  10/03/10 171 lb (77.565 kg)   Constitutional: She appears well-developed and well-nourished. No distress.  HENT: Head: Normocephalic and atraumatic. Ears: B TMs ok, no erythema or effusion; Nose: Nose normal.  Mouth/Throat: Oropharynx is clear and moist. No oropharyngeal exudate.  Eyes: Conjunctivae and EOM are normal. Pupils are equal, round, and reactive to light. No scleral icterus.  Neck: Normal range of motion. Neck supple. No JVD present. No thyromegaly  present.  Cardiovascular: Normal rate, regular rhythm and normal heart sounds.  No murmur heard. No BLE edema. Pulmonary/Chest: Effort normal and breath sounds normal. No respiratory distress. She has no wheezes.  Neurological: She is alert and oriented to person, place, and time. No cranial nerve deficit. Coordination normal.  Psych: Flat mood and affect. Normal judgment and insight  Lab Results  Component Value Date   WBC 9.3 12/21/2010   HGB 10.4* 12/21/2010   HCT 30.7* 12/21/2010   PLT 195 12/21/2010   GLUCOSE 80 04/09/2010   NA 142 04/09/2010   K 4.0 04/09/2010   CL 106 04/09/2010   CREATININE 0.7 04/09/2010   BUN 10 04/09/2010   CO2 29 04/09/2010   TSH 5.77* 04/09/2010       Assessment & Plan:  Dizzy - mild hypotension and fatigue - not positional -  daily symptoms ranging in severity of mild light head to true vertigo Exam and history today benign  Fatigue - ?postpartum depression or if related to above  Hypothyroid - not rechecked since delivery 12/20/10  >>>>> Check screening labs including TSH, CBC, basic metabolic and random cortisol Consider endocrinology evaluation if abnormalities arise Consider antidepressant for postpartum depression symptoms if labs are okay  Discussed above with patient who understands and agrees

## 2011-02-22 NOTE — Patient Instructions (Signed)
It was good to see you today. Test(s) ordered today. Your results will be called to you after review (48-72hours after test completion). If any changes need to be made, you will be notified at that time. Medications reviewed, no changes at this time.  If labs unremarkable, we'll consider treatment for postpartum depression symptoms  Postpartum Depression After delivery, your body is going through a drastic change in hormone levels. You may find yourself crying for no apparent reason and unable to cope with all the changes a new baby brings. This is a common response following a pregnancy. Seek support from your partner and/or friends and just give yourself time to recover. If these feelings persist and you feel you are getting worse, contact your caregiver or other professionals who can help you. WHAT IS DEPRESSION? Depression can be described as feeling sad, blue, unhappy, miserable, or down in the dumps. Most of [place] feel this way at one time or another for short periods. But true clinical depression is a mood disorder in which feelings of sadness, loss, anger, fear, or frustration interfere with everyday life for an extended time. Depression can be mild, moderate, or severe. The degree of depression, which your caregiver can determine, influences your treatment. Postpartum depression occurs within a couple days to months after delivering your baby. HOW COMMON IS DEPRESSION DURING AND AFTER PREGNANCY? Depression that occurs during pregnancy or within a year after delivery is called perinatal depression. Depression after pregnancy is also called postpartum depression or peripartum depression. The exact number of women with depression during this time is unknown, but it occurs in between 10-15% of women. Researchers believe that depression is one of the most common complications during and after pregnancy. The depression is often not recognized or treated, because some normal pregnancy changes cause similar  symptoms and are happening at the same time. Tiredness, problems sleeping, stronger emotional reactions, and changes in body weight may occur during and after pregnancy. But these symptoms may also be signs of depression.   CAUSES  Rapid hormone changes. Estrogen and progesterone usually decrease immediately after delivering your baby. Researchers think the fast change in hormone levels may lead to depression, just as smaller changes in hormones can affect a woman's moods before she gets her menstrual period.     Decrease in thyroid hormone. Thyroid hormone regulates how your body uses and stores energy from food (metabolism). A simple blood test can tell if this condition is causing a woman's depression. If so, thyroid medicine can be prescribed by your caregiver.     A stressful life event, such as a death in the family. This can cause chemical changes in the brain that lead to depression.     Feeling overwhelmed by caring for and raising a new baby.     Depression is also an illness that runs in some families. It is not always clear what causes depression.  FACTORS THAT MAY INCREASE A WOMAN'S CHANCE OF DEPRESSION DURING PREGNANCY:  History of depression.     Substance abuse, alcohol, or drugs.     Little support from family and friends.     Problems with previous pregnancy or birth.     Young age for motherhood.     Living alone.     Little or no social support.     Family history of mental illness.     Anxiety about the fetus.     Marital or financial problems.     Postpartum depression in a previous  pregnancy.     Having a psychiatric illness (schizophrenia, bipolar disorder).     Going through a difficult or stressful pregnancy.     Going through a difficult labor and delivery.     Moving to another city or state during your pregnancy, or just after delivering your baby.  OTHER FACTORS THAT MAY CONTRIBUTE TO POSTPARTUM DEPRESSION INCLUDE:    Feeling tired after  delivery, broken sleep patterns, and not getting enough rest. This often keeps a new mother from regaining her full strength for weeks.     Feeling overwhelmed with a new baby to take care of and doubting your ability to be a good mother.     Feeling stress from changes in work and home routines. Women sometimes think they need to be "super mom" or perfect. This is not realistic and can add stress.     Having feelings of loss. This can include loss of the identity of who you are, or were, before having the baby, loss of control, loss of your pre-pregnancy figure, and feeling less attractive.     Having less free time and less control over your time. Needing to stay home, indoors, for longer periods of time and having less time to spend with your partner and loved ones can contribute to depression.     Having trouble doing your daily activities at home or at work.     Fears about not knowing how to take of the baby correctly and about harming the baby.     Feelings of guilt that you are not taking care of the baby properly.  SYMPTOMS Any of these symptoms, during and after pregnancy, that last longer than 2 weeks are signs of depression:  Feeling restless or irritable.     Feeling sad, hopeless, and overwhelmed.     Crying a lot.     Having no energy or motivation.     Eating too little or too much.     Sleeping too little or too much.     Trouble focusing, remembering, or making decisions.     Feeling worthless and guilty.     Loss of interest or pleasure in activities.     Withdrawal from friends and family.     Having headaches, chest pains, rapid or irregular heartbeat (palpitations), or fast and shallow breathing (hyperventilation).     After pregnancy, being afraid of hurting the baby or oneself, and not having any interest in the baby.     Not being able to care for yourself or the baby.     Loss of interest in caring for the baby.     Anxiety and panic attacks.       Thoughts of harming yourself, the baby, or someone else.     Feelings of guilt because you feel you are not taking care of the baby well enough.  WHAT IS THE DIFFERENCE BETWEEN "BABY BLUES," POSTPARTUM DEPRESSION, AND POSTPARTUM PSYCHOSIS?  The "baby blues" occurs 70 to 80% of the time, and it can happen in the days right after childbirth. It normally goes away within a few days to a week. A new mother can have sudden mood swings, sadness, crying spells, loss of appetite, sleeping problems, and feel irritable, restless, anxious, and lonely. Symptoms are not severe and treatment usually is not needed. But there are things you can do to feel better. Nap when the baby does. Ask for help from your spouse, family members, and friends. Join a support  group of new moms or talk with other moms. If the "baby blues" does not go away in a week to 10 days or gets worse, you may have postpartum depression.     Postpartum depression can happen anytime within the first year after childbirth. A woman may have a number of symptoms, such as sadness, lack of energy, trouble concentrating, anxiety, and feelings of guilt and worthlessness. The difference between postpartum depression and the "baby blues" is that the feelings in postpartum depression are much stronger and often affects a woman's well-being. It keeps her from functioning well for a longer period of time. Postpartum depression needs to be treated by a caregiver. Counseling, support groups, and medicines can help.     Postpartum psychosis is rare. It occurs in 1 or 2 out of every 1000 births. It usually begins in the first 6 weeks after delivery. Women who have bipolar disorder, schizoaffective disorder, or family history of psychotic disease have a higher risk for developing postpartum psychosis. Symptoms may include delusions, hallucinations, sleep disturbances, and obsessive thoughts about the baby. A woman may have rapid mood swings, from depression, to  irritability, to euphoria. This is a serious condition and needs professional care and treatment.  WHAT STEPS CAN I TAKE IF I HAVE SYMPTOMS OF DEPRESSION DURING PREGNANCY OR AFTER CHILDBIRTH?  Some women do not tell anyone about their symptoms, because they feel embarrassed, ashamed, or guilty about feeling depressed when they are supposed to be happy. They worry that they will be viewed as unfit parents. Perinatal depression can happen to any woman. It does not mean you are a bad or a "not together" mom. You and your baby do not need to suffer. There is help. You should discuss these feelings with your spouse or partner, family, and caregiver.     There are different types of individual and group "talk therapies" that can help a woman with perinatal depression feel better and do better as a mom and as a person. Limited research suggests that many women with perinatal depression improve when treated with antidepressant medicine. Your caregiver can help you learn more about these options and decide which approach is best for you and your baby.     Speak to your caregiver if you are having symptoms of depression while you are pregnant or after you deliver your baby. Your caregiver can give you a questionnaire to test for depression. You can also be referred to a mental health professional who specializes in treating depression.  HOME CARE INSTRUCTIONS  Try to get as much rest as you can. Try to nap when the baby naps.     Stop putting pressure on yourself to do everything. Do as much as you can and leave the rest.     Ask for help with household chores and nighttime feedings. Ask your partner to bring the baby to you so you can breastfeed. If you can, have a friend, family member, or professional support person help you in the home for part of the day.     Talk to your partner, family, and friends about how you are feeling.     Do not spend a lot of time alone. Get dressed and leave the house. Run an  errand or take a short walk.     Spend time alone with your partner.     Talk with other mothers so you can learn from their experiences.     Join a support group for women with  depression. Call a local hotline or look in your telephone book for information and services.     Do not make any major life changes during pregnancy. Major changes can cause unneeded stress. However, sometimes big changes cannot be avoided. Arrange support and help in your new situation ahead of time.     Exercise regularly.     Eat a balanced and nourishing diet.     Seek help if there are marital or financial problems.     Take the medicine your caregiver gives, as directed.     Keep all your postpartum appointments.  TREATMENT There are 2 common types of treatment for depression.  Talk therapy. This involves talking to a therapist, psychologist, clergyperson, or social worker, in order to learn to change how depression makes you think, feel, and act.     Medicine. Your caregiver can give you an antidepressant medicine to help you. These medicines can help relieve the symptoms of depression.     Women who are pregnant or breast-feeding should talk with their caregivers about the advantages and risks of taking antidepressant medicines. Some women are concerned that taking these medicines may harm the baby. A mother's depression can affect her baby's development. Getting treatment is important for both mother and baby. The risks of taking medicine must be weighed against the risks of depression. It is a decision that women need to discuss carefully with their caregivers. Women who decide to take antidepressant medicines should talk to their caregivers about which antidepressant medicines are safer to take while pregnant or breastfeeding.  What effects can untreated depression have?  Depression not only hurts the mother, but it also affects her family. Some researchers have found that depression during pregnancy  can raise the risk of delivering an underweight baby or a premature infant. Some women with depression have difficulty caring for themselves during pregnancy. They may have trouble eating and do not gain enough weight during the pregnancy. They may also have trouble sleeping, may miss prenatal visits, may not follow medical instructions, have a poor diet, or may use harmful substances, like tobacco, alcohol, or illegal drugs.     Postpartum depression can affect a mother's ability to parent. She may lack energy, have trouble concentrating, be irritable, and not be able to meet her child's needs for love and affection. As a result, she may feel guilty and lose confidence in herself as a mother. This can make the depression worse. Researchers believe that postpartum depression can affect the infant by causing delays in language development, problems with emotional bonding to others, behavioral problems, lower activity levels, sleep problems, and distress. It helps if the father or another caregiver can assist in meeting the needs of the baby, and other children in the family, while the mother is depressed.     All children deserve the chance to have a healthy mom. All moms deserve the chance to enjoy their life and their children. Do not suffer alone. If you are experiencing symptoms of depression during pregnancy or after having a baby, tell a loved one and call your caregiver right away.  SEEK MEDICAL CARE IF:  You think you have postpartum depression.     You want medicine to treat your postpartum depression.     You want a referral to a psychiatrist or psychologist.     You are having a reaction or problems with your medicine.  SEEK IMMEDIATE MEDICAL CARE IF:  You have suicidal feelings.  You feel you may harm the baby.     You feel you may harm your spouse/partner, or someone else.     You feel you need to be admitted to a hospital now.     You feel you are losing control and need  treatment immediately.  FOR MORE INFORMATION [employer] operational [employer] Health Information Center: http://hoffman.com/ National Institute of Mental Health, NIH, HHS: http://www.maynard.net/ American Psychological Association: DiceTournament.ca   Postpartum Education for Parents: www.sbpep.org National Mental Health Information Center, SAMHSA, HHS: www.mentalhealth.org   National Mental Health Association: www.nmha.org Postpartum Support International: www.postpartum.net   Document Released: 12/10/2003 Document Revised: 11/17/2010 Document Reviewed: 03/19/2009 [facility] Patient Information 2012 [place], [place].

## 2011-02-23 ENCOUNTER — Ambulatory Visit (INDEPENDENT_AMBULATORY_CARE_PROVIDER_SITE_OTHER): Payer: 59 | Admitting: Internal Medicine

## 2011-02-23 ENCOUNTER — Encounter: Payer: Self-pay | Admitting: Endocrinology

## 2011-02-23 ENCOUNTER — Other Ambulatory Visit: Payer: Self-pay | Admitting: Internal Medicine

## 2011-02-23 ENCOUNTER — Ambulatory Visit (INDEPENDENT_AMBULATORY_CARE_PROVIDER_SITE_OTHER): Payer: 59 | Admitting: Endocrinology

## 2011-02-23 ENCOUNTER — Encounter: Payer: Self-pay | Admitting: Internal Medicine

## 2011-02-23 VITALS — BP 92/62 | HR 60 | Temp 97.5°F | Resp 18

## 2011-02-23 DIAGNOSIS — E162 Hypoglycemia, unspecified: Secondary | ICD-10-CM

## 2011-02-23 NOTE — Progress Notes (Signed)
Subjective:    Patient ID: Kayla Wagner, female    DOB: 1976-01-21, 35 y.o.   MRN: [id number]  HPI Pt is 2 mos postpartum.  Pt reports 3 mos intermittent moderate dizziness sensation in the head, and assoc salt craving.  She is unaware of any relationship of her sxs to meals.  Yesterday, she was noted to have hypoglycemia on a lab glucose, in the context of the postprandial period.  During her pregnancy, she had a normal ogtt.  She is breast-feeding.  She says she takes no meds to lower glucose. Past Medical History  Diagnosis Date  . ALLERGIC RHINITIS   . ASTHMA   . DACRYOCYSTITIS   . HYPOTHYROIDISM 2008 dx    Past Surgical History  Procedure Date  . No past surgeries     History   Social History  . Marital Status: Married    Spouse Name: N/A    Number of Children: N/A  . Years of Education: N/A   Occupational History  . Not on file.   Social History Main Topics  . Smoking status: Never Smoker   . Smokeless tobacco: Not on file   Comment: Married,lives with spouse and newborn dtr  . Alcohol Use: No  . Drug Use: No  . Sexually Active: Yes   Other Topics Concern  . Not on file   Social History Narrative  . No narrative on file    Current Outpatient Prescriptions on File Prior to Visit  Medication Sig Dispense Refill  . FOLIC ACID PO Take by mouth.        . levothyroxine (SYNTHROID, LEVOTHROID) 50 MCG tablet TAKE 1 TABLET BY MOUTH DAILY EXCEPT SUNDAYS WHEN TAKING THE  30 tablet  5  . levothyroxine (SYNTHROID, LEVOTHROID) 75 MCG tablet TAKE 1 TABLET BY MOUTH DAILY ON SUNDAYS  30 tablet  1  . loratadine (CLARITIN) 10 MG tablet Take 10 mg by mouth daily as needed.        . ORTHO MICRONOR 0.35 MG tablet Take 1 by mouth daily      . prenatal vitamin w/FE, FA (PRENATAL 1 + 1) 27-1 MG TABS Take 1 tablet by mouth daily.          Allergies  Allergen Reactions  . Fioricet (Butalbital-Apap-Caffeine) Hives    Hives all over body    Family History    Problem Relation Age of Onset  . Alcohol abuse Other   . Arthritis Other     grandparent  . Diabetes Other     parent, grandparent  . Hyperlipidemia Other     parent, other relative    BP 92/62  Pulse 60  Temp(Src) 97.5 F (36.4 C) (Oral)  Wt 161 lb (73.029 kg)  SpO2 98%  Breastfeeding? Yes   Review of Systems She also reports irritability, headache, insomnia, diarrhea, abd pain, depression.   denies fever, visual loss, sob, urinary frequency, arthralgias, cramps, excessive diaphoresis, n/v, and numbness.  Menses have not resumed. She gained 30 lbs with the pregnancy, and has since re-lost about half of that.  She has a rash at the buttocks--she saw her ob for this. No itching.     Objective:   Physical Exam VITAL SIGNS:  See vs page GENERAL: no distress NECK: There is no palpable thyroid enlargement.  No thyroid nodule is palpable.  No palpable lymphadenopathy at the anterior neck. PSYCH: Alert and oriented x 3.  Does not appear anxious nor depressed.   Lab Results  Component Value  Date   WBC 4.9 02/22/2011   HGB 13.4 02/22/2011   HCT 39.4 02/22/2011   PLT 237.0 02/22/2011   GLUCOSE 46* 02/22/2011   ALT 30 02/22/2011   AST 30 02/22/2011   NA 143 02/22/2011   K 3.9 02/22/2011   CL 108 02/22/2011   CREATININE 0.9 02/22/2011   BUN 14 02/22/2011   CO2 26 02/22/2011   TSH 0.96 02/22/2011      Assessment & Plan:  Mild reactive hypoglycemia, new.  This is a risk-factor for DM. Dizziness, uncertain etiology Hypothyroidism, well-replaced

## 2011-02-23 NOTE — Patient Instructions (Addendum)
Here is a blood-sugar meter, and some sample strips.   Check if you have symptoms, especially the dizziness.   Call if your blood sugar is below 40.   You should consider have a "before and after" test of your cortisone.  Please call if you decide to have it done.

## 2011-02-23 NOTE — Progress Notes (Signed)
  Subjective:    Patient ID: Kayla Wagner, female    DOB: 1975/08/31, 35 y.o.   MRN: [id number]  HPI  Here for followup> severe hypoglycemia noted on labs yesterday No history of diabetes and no hypoglycemic meds or agents known  Review of Systems General: Continued fatigue, no syncope, fever    Objective:   Physical Exam BP 92/62  Pulse 60  Temp 97.5 F (36.4 C)  Resp 18  SpO2 98% Gen: NAD Lung: CTA CV: RRR  Lab Results  Component Value Date   WBC 4.9 02/22/2011   HGB 13.4 02/22/2011   HCT 39.4 02/22/2011   PLT 237.0 02/22/2011   GLUCOSE 46* 02/22/2011   ALT 30 02/22/2011   AST 30 02/22/2011   NA 143 02/22/2011   K 3.9 02/22/2011   CL 108 02/22/2011   CREATININE 0.9 02/22/2011   BUN 14 02/22/2011   CO2 26 02/22/2011   TSH 0.96 02/22/2011         Assessment & Plan:  Severe hyperglycemia, random Possible explanation for fatigue and dizziness symptoms as discussed yesterday  Recommend evaluation by endocrinology for same, discussed with Dr. Everardo All who will see her today

## 2011-02-23 NOTE — Patient Instructions (Signed)
Dr. Everardo All will see you today for your low blood sugar Keep followup with me as planned to discuss outcome and possible overlapping depression symptoms

## 2011-02-24 ENCOUNTER — Ambulatory Visit: Payer: 59 | Admitting: Endocrinology

## 2011-02-28 ENCOUNTER — Telehealth: Payer: Self-pay

## 2011-02-28 NOTE — Telephone Encounter (Signed)
[facility] Triage Call Report Triage Record Num: [id number] Operator: Hillary Bow Patient Name: Kayla Wagner [facility] Call Date & Time: 02/22/2011 6:30:16PM Patient Phone: [phone] PCP: Rene Paci Patient Gender: Female PCP Fax : [phone] Patient DOB: 04-01-1975 Practice Name: [facility] Reason for Call: Caller: Archie Patten w/ [facility]; PCP: Rene Paci; CB#: [phone]; Call Reason: Critical Glucose Level; Sx Notes: Calling w/ Crticial Glucose = 45. All HX Glucose have been normal. PT # [phone]. Contacted Pt, Pt has had breakfast, lunch,gatoraid. Pt states, I feel lightheaded. Pt is breastfeeding currently, doesn't sound like Pt is eating enough Carbs. Pt has had severe dizzy episodes since giving birth, 12-20-10. Consulted w/ Dr Alwyn Ren advised Pt to c/b on 02-23-11 for a fasting FSBS and to get Glucometer. Discussed high fiber foods, fruits, yogurt, milk and more protein type foods to hold glucose levels especially while breast feeding. Advised Pt to drink OJ while on the phone. Pt verbalized understanding. Diabetes Control Protocol used. Protocol(s) Used: Diabetes: Control Problems Recommended Outcome per Protocol: Call Provider within 24 Hours Reason for Outcome: Two or more hypoglycemic episodes (now resolved) in the last week AND unusual for the individual Care Advice: ~ 02/22/2011 7:02:50PM Page 1 of 1 [email or web address]

## 2011-04-02 ENCOUNTER — Other Ambulatory Visit: Payer: Self-pay | Admitting: Endocrinology

## 2011-05-05 ENCOUNTER — Other Ambulatory Visit: Payer: Self-pay | Admitting: Internal Medicine

## 2011-05-25 ENCOUNTER — Ambulatory Visit (INDEPENDENT_AMBULATORY_CARE_PROVIDER_SITE_OTHER): Payer: 59 | Admitting: Internal Medicine

## 2011-05-25 ENCOUNTER — Other Ambulatory Visit (INDEPENDENT_AMBULATORY_CARE_PROVIDER_SITE_OTHER): Payer: 59

## 2011-05-25 ENCOUNTER — Encounter: Payer: Self-pay | Admitting: Internal Medicine

## 2011-05-25 DIAGNOSIS — E039 Hypothyroidism, unspecified: Secondary | ICD-10-CM

## 2011-05-25 DIAGNOSIS — I9589 Other hypotension: Secondary | ICD-10-CM

## 2011-05-25 DIAGNOSIS — E162 Hypoglycemia, unspecified: Secondary | ICD-10-CM

## 2011-05-25 LAB — BASIC METABOLIC PANEL
CO2: 28 mEq/L (ref 19–32)
Calcium: 9.5 mg/dL (ref 8.4–10.5)
Chloride: 105 mEq/L (ref 96–112)
Creatinine, Ser: 0.7 mg/dL (ref 0.4–1.2)
Glucose, Bld: 88 mg/dL (ref 70–99)

## 2011-05-25 NOTE — Progress Notes (Signed)
  Subjective:    Patient ID: Kayla Wagner, female    DOB: 1976/03/05, 36 y.o.   MRN: [id number]  HPI here for follow up -  Continued episodic dizzy sensation Onset 3-4 months ago prior to end of pregnancy October 2012. Describes as room spinning sensation, intermittent symptoms Not precipitated by position change or head movement Feels symptoms associated with lower than normal blood pressure including low blood pressure during delivery Question cortisol deficiency as new salt cravings since end of pregnancy No headache, no peripheral numbness or weakness, no falls No vision change, chest pain, shortness of breath or palpitations  Hypothyroid -no dose changes since completion of pregnancy. Due for lab recheck today  ? postpartum depression. "I just don't feel happy". Denies SI/HI. No prior history of personal depression or anxiety. Manifest with hypersomnia and poor sleep, extreme fatigue and irritability. Improving with increase support from spouse   Past Medical History  Diagnosis Date  . ALLERGIC RHINITIS   . ASTHMA   . DACRYOCYSTITIS   . HYPOTHYROIDISM 2008 dx    Review of Systems  Constitutional: Positive for fatigue. Negative for fever.  Eyes: Negative for pain and visual disturbance.  Neurological: Negative for facial asymmetry and headaches.       Objective:   Physical Exam  BP 90/62  Pulse 63  Temp(Src) 98.5 F (36.9 C) (Oral)  Ht 5\' 10"  (1.778 m)  Wt 160 lb 12.8 oz (72.938 kg)  BMI 23.07 kg/m2  SpO2 98% Wt Readings from Last 3 Encounters:  05/25/11 160 lb 12.8 oz (72.938 kg)  02/23/11 161 lb (73.029 kg)  02/22/11 161 lb (73.029 kg)   Constitutional: She is tall/thin, but appears well-developed and well-nourished. No distress.  Eyes: Conjunctivae and EOM are normal. Pupils are equal, round, and reactive to light. No scleral icterus.  Neck: Normal range of motion. Neck supple. No JVD present. No thyromegaly present.  Cardiovascular: Normal rate,  regular rhythm and normal heart sounds.  No murmur heard. No BLE edema. Pulmonary/Chest: Effort normal and breath sounds normal. No respiratory distress. She has no wheezes.  Psych: slightly dysphoric mood and affect. Normal judgment and insight  Lab Results  Component Value Date   WBC 4.9 02/22/2011   HGB 13.4 02/22/2011   HCT 39.4 02/22/2011   PLT 237.0 02/22/2011   GLUCOSE 46* 02/22/2011   ALT 30 02/22/2011   AST 30 02/22/2011   NA 143 02/22/2011   K 3.9 02/22/2011   CL 108 02/22/2011   CREATININE 0.9 02/22/2011   BUN 14 02/22/2011   CO2 26 02/22/2011   TSH 0.96 02/22/2011       Assessment & Plan:  Dizzy - mild hypotension and fatigue - not positional -  improved daily symptoms ranging in severity of mild light head to true vertigo - now only weekly Feels "stress related" or "sleep deprivation" related as improved dramatically when spouse completed semester of school and was available to help at home with childcare Recheck random cortisol and advised follow up with endo is persisting symptoms or abnormal cortisol

## 2011-05-25 NOTE — Assessment & Plan Note (Signed)
Lab Results  Component Value Date   TSH 0.96 02/22/2011  recheck now, adjust as needed The current medical regimen is effective;  continue present plan and medications.

## 2011-05-25 NOTE — Patient Instructions (Signed)
It was good to see you today. Test(s) ordered today. Your results will be called to you after review (48-72hours after test completion). If any changes need to be made, you will be notified at that time. Medications reviewed, no changes at this time. Please schedule followup in 6 months, call sooner if problems.  

## 2011-05-25 NOTE — Assessment & Plan Note (Signed)
Incidental on labs December 2012 Status post endocrine evaluation - no evidence for specific abnormality Patient has rechecked her sugar at home, no evidence of recurrent event even in face of dizziness and lightheaded symptoms at home No results found for this basename: HGBA1C

## 2011-10-19 ENCOUNTER — Other Ambulatory Visit: Payer: Self-pay | Admitting: Internal Medicine

## 2011-11-25 ENCOUNTER — Other Ambulatory Visit (INDEPENDENT_AMBULATORY_CARE_PROVIDER_SITE_OTHER): Payer: 59

## 2011-11-25 ENCOUNTER — Ambulatory Visit (INDEPENDENT_AMBULATORY_CARE_PROVIDER_SITE_OTHER): Payer: 59 | Admitting: Internal Medicine

## 2011-11-25 ENCOUNTER — Ambulatory Visit: Payer: 59 | Admitting: Internal Medicine

## 2011-11-25 ENCOUNTER — Encounter: Payer: Self-pay | Admitting: Internal Medicine

## 2011-11-25 VITALS — BP 102/62 | HR 67 | Temp 97.2°F | Ht 70.0 in | Wt 147.1 lb

## 2011-11-25 DIAGNOSIS — Z0000 Encounter for general adult medical examination without abnormal findings: Secondary | ICD-10-CM

## 2011-11-25 DIAGNOSIS — E039 Hypothyroidism, unspecified: Secondary | ICD-10-CM

## 2011-11-25 DIAGNOSIS — Z23 Encounter for immunization: Secondary | ICD-10-CM

## 2011-11-25 LAB — BASIC METABOLIC PANEL
BUN: 17 mg/dL (ref 6–23)
Chloride: 108 mEq/L (ref 96–112)
GFR: 91.63 mL/min (ref >60.00)
Potassium: 4.2 mEq/L (ref 3.5–5.1)
Sodium: 142 mEq/L (ref 135–145)

## 2011-11-25 LAB — CBC WITH DIFFERENTIAL/PLATELET
Basophils Relative: 0.7 % (ref 0.0–3.0)
Eosinophils Relative: 3.8 % (ref 0.0–5.0)
HCT: 42.5 % (ref 36.0–46.0)
Hemoglobin: 14.3 g/dL (ref 12.0–15.0)
Lymphs Abs: 1.7 10*3/uL (ref 0.7–4.0)
MCV: 90.4 fl (ref 78.0–100.0)
Monocytes Absolute: 0.5 10*3/uL (ref 0.1–1.0)
Monocytes Relative: 10.7 % (ref 3.0–12.0)
Neutro Abs: 2.3 10*3/uL (ref 1.4–7.7)
RBC: 4.7 Mil/uL (ref 3.87–5.11)
WBC: 4.7 10*3/uL (ref 4.5–10.5)

## 2011-11-25 LAB — URINALYSIS, ROUTINE W REFLEX MICROSCOPIC
Bilirubin Urine: NEGATIVE
Total Protein, Urine: NEGATIVE
Urine Glucose: NEGATIVE

## 2011-11-25 LAB — LIPID PANEL
Cholesterol: 185 mg/dL (ref 0–200)
HDL: 46.9 mg/dL (ref >39.00)
LDL Cholesterol: 111 mg/dL — ABNORMAL HIGH (ref 0–99)
VLDL: 27 mg/dL (ref 0.0–40.0)

## 2011-11-25 LAB — HEPATIC FUNCTION PANEL
ALT: 18 U/L (ref 0–35)
Bilirubin, Direct: 0 mg/dL (ref 0.0–0.3)
Total Protein: 7.1 g/dL (ref 6.0–8.3)

## 2011-11-25 LAB — TSH: TSH: 1 u[IU]/mL (ref 0.35–5.50)

## 2011-11-25 NOTE — Progress Notes (Signed)
Subjective:    Patient ID: Kayla Wagner, female    DOB: [birth year], 36 y.o.   MRN: [id number]  HPI patient is here today for annual physical. Patient feels well overall  also reviewed chronic medical issues:  Hypothyroid -no dose changes since completion of pregnancy 10/12. 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 Denies SI/HI. No prior history of personal depression or anxiety. Manifest with hypersomnia and poor sleep, extreme fatigue and irritability. Improved with increase support from spouse and exercise  Incidental mild hypoglycemia 12/12 - eval by endo for same 12/12 - no cortisol abnormality - no known recurrence or symptoms of low blood sugar  Past Medical History  Diagnosis Date  . ALLERGIC RHINITIS   . ASTHMA   . DACRYOCYSTITIS   . HYPOTHYROIDISM 2008 dx   Family History  Problem Relation Age of Onset  . Alcohol abuse Other   . Arthritis Other     grandparent  . Diabetes Father   . Hyperlipidemia Father   . Thyroid cancer Cousin 30   History  Substance Use Topics  . Smoking status: Never Smoker   . Smokeless tobacco: Not on file   Comment: Married,lives with spouse and newborn dtr  . Alcohol Use: No    Review of Systems  Constitutional: Negative for fever or weight change.  Respiratory: Negative for cough and shortness of breath.   Cardiovascular: Negative for chest pain or palpitations.  Gastrointestinal: Negative for abdominal pain, no bowel changes.  Musculoskeletal: Negative for gait problem or joint swelling.  Skin: Negative for rash.  Neurological: Negative for dizziness or headache.  No other specific complaints in a complete review of systems (except as listed in HPI above).      Objective:   Physical Exam  BP 102/62  Pulse 67  Temp 97.2 F (36.2 C) (Oral)  Ht 5\' 10"  (1.778 m)  Wt 147 lb 1.9 oz (66.733 kg)  BMI 21.11 kg/m2  SpO2 98% Wt Readings from Last 3 Encounters:    11/25/11 147 lb 1.9 oz (66.733 kg)  05/25/11 160 lb 12.8 oz (72.938 kg)  02/23/11 161 lb (73.029 kg)   Constitutional: She is tall/thin, but appears well-developed and well-nourished. No distress.  HENT: Head: Normocephalic and atraumatic. Ears: B TMs ok, no erythema or effusion; Nose: Nose normal. Mouth/Throat: Oropharynx is clear and moist. No oropharyngeal exudate.  Eyes: Conjunctivae and EOM are normal. Pupils are equal, round, and reactive to light. No scleral icterus.  Neck: Normal range of motion. Neck supple. No JVD present. No thyromegaly present.  Cardiovascular: Normal rate, regular rhythm and normal heart sounds.  No murmur heard. No BLE edema. Pulmonary/Chest: Effort normal and breath sounds normal. No respiratory distress. She has no wheezes.  Abdominal: Soft. Bowel sounds are normal. She exhibits no distension. There is no tenderness. no masses Musculoskeletal: Normal range of motion, no joint effusions. No gross deformities Neurological: She is alert and oriented to person, place, and time. No cranial nerve deficit. Coordination normal.  Skin: Skin is warm and dry. No rash noted. No erythema.  Psychiatric: She has a normal mood and affect. Her behavior is normal. Judgment and thought content normal.     Lab Results  Component Value Date   WBC 4.9 02/22/2011   HGB 13.4 02/22/2011   HCT 39.4 02/22/2011   PLT 237.0 02/22/2011   GLUCOSE 88 05/25/2011   ALT 30 02/22/2011   AST 30 02/22/2011   NA 139 05/25/2011  K 4.2 05/25/2011   CL 105 05/25/2011   CREATININE 0.7 05/25/2011   BUN 10 05/25/2011   CO2 28 05/25/2011   TSH 1.20 05/25/2011   HGBA1C 5.0 05/25/2011       Assessment & Plan:   CPX/v70.0 - Patient has been counseled on age-appropriate routine health concerns for screening and prevention. These are reviewed and up-to-date. Immunizations are up-to-date or declined. Labs and ECG reviewed.  Also See problem list. Medications and labs reviewed today.

## 2011-11-25 NOTE — Patient Instructions (Signed)
It was good to see you today. We have reviewed your prior records including labs and tests today Health Maintenance reviewed - flu shot today -all other recommended immunizations and age-appropriate screenings are up-to-date. Test(s) ordered today. Your results will be called to you after review (48-72hours after test completion). If any changes need to be made, you will be notified at that time. Please schedule followup in 6 months for thyroid check, call sooner if problems.   Health Maintenance, Females A healthy lifestyle and preventative care can promote health and wellness.  Maintain regular health, dental, and eye exams.   Eat a healthy diet. Foods like vegetables, fruits, whole grains, low-fat dairy products, and lean protein foods contain the nutrients you need without too many calories. Decrease your intake of foods high in solid fats, added sugars, and salt. Get information about a proper diet from your caregiver, if necessary.   Regular physical exercise is one of the most important things you can do for your health. Most adults should get at least 150 minutes of moderate-intensity exercise (any activity that increases your heart rate and causes you to sweat) each week. In addition, most adults need muscle-strengthening exercises on 2 or more days a week.     Maintain a healthy weight. The body mass index (BMI) is a screening tool to identify possible weight problems. It provides an estimate of body fat based on height and weight. Your caregiver can help determine your BMI, and can help you achieve or maintain a healthy weight. For adults 20 years and older:   A BMI below 18.5 is considered underweight.   A BMI of 18.5 to 24.9 is normal.   A BMI of 25 to 29.9 is considered overweight.   A BMI of 30 and above is considered obese.   Maintain normal blood lipids and cholesterol by exercising and minimizing your intake of saturated fat. Eat a balanced diet with plenty of fruits and  vegetables. Blood tests for lipids and cholesterol should begin at age 78 and be repeated every 5 years. If your lipid or cholesterol levels are high, you are over 50, or you are a high risk for heart disease, you may need your cholesterol levels checked more frequently. Ongoing high lipid and cholesterol levels should be treated with medicines if diet and exercise are not effective.   If you smoke, find out from your caregiver how to quit. If you do not use tobacco, do not start.   If you are pregnant, do not drink alcohol. If you are breastfeeding, be very cautious about drinking alcohol. If you are not pregnant and choose to drink alcohol, do not exceed 1 drink per day. One drink is considered to be 12 ounces (355 mL) of beer, 5 ounces (148 mL) of wine, or 1.5 ounces (44 mL) of liquor.   Avoid use of street drugs. Do not share needles with anyone. Ask for help if you need support or instructions about stopping the use of drugs.   High blood pressure causes heart disease and increases the risk of stroke. Blood pressure should be checked at least every 1 to 2 years. Ongoing high blood pressure should be treated with medicines, if weight loss and exercise are not effective.   If you are 62 to 36 years old, ask your caregiver if you should take aspirin to prevent strokes.   Diabetes screening involves taking a blood sample to check your fasting blood sugar level. This should be done once every 3  years, after age 18, if you are within normal weight and without risk factors for diabetes. Testing should be considered at a younger age or be carried out more frequently if you are overweight and have at least 1 risk factor for diabetes.   Breast cancer screening is essential preventative care for women. You should practice "breast self-awareness." This means understanding the normal appearance and feel of your breasts and may include breast self-examination. Any changes detected, no matter how small, should  be reported to a caregiver. Women in their 66s and 30s should have a clinical breast exam (CBE) by a caregiver as part of a regular health exam every 1 to 3 years. After age 52, women should have a CBE every year. Starting at age 31, women should consider having a mammogram (breast X-ray) every year. Women who have a family history of breast cancer should talk to their caregiver about genetic screening. Women at a high risk of breast cancer should talk to their caregiver about having an MRI and a mammogram every year.   The Pap test is a screening test for cervical cancer. Women should have a Pap test starting at age 45. Between ages 31 and 35, Pap tests should be repeated every 2 years. Beginning at age 15, you should have a Pap test every 3 years as long as the past 3 Pap tests have been normal. If you had a hysterectomy for a problem that was not cancer or a condition that could lead to cancer, then you no longer need Pap tests. If you are between ages 2 and 39, and you have had normal Pap tests going back 10 years, you no longer need Pap tests. If you have had past treatment for cervical cancer or a condition that could lead to cancer, you need Pap tests and screening for cancer for at least 20 years after your treatment. If Pap tests have been discontinued, risk factors (such as a new sexual partner) need to be reassessed to determine if screening should be resumed. Some women have medical problems that increase the chance of getting cervical cancer. In these cases, your caregiver may recommend more frequent screening and Pap tests.   The human papillomavirus (HPV) test is an additional test that may be used for cervical cancer screening. The HPV test looks for the virus that can cause the cell changes on the cervix. The cells collected during the Pap test can be tested for HPV. The HPV test could be used to screen women aged 71 years and older, and should be used in women of any age who have unclear Pap  test results. After the age of 13, women should have HPV testing at the same frequency as a Pap test.   Colorectal cancer can be detected and often prevented. Most routine colorectal cancer screening begins at the age of 51 and continues through age 62. However, your caregiver may recommend screening at an earlier age if you have risk factors for colon cancer. On a yearly basis, your caregiver may provide home test kits to check for hidden blood in the stool. Use of a small camera at the end of a tube, to directly examine the colon (sigmoidoscopy or colonoscopy), can detect the earliest forms of colorectal cancer. Talk to your caregiver about this at age 70, when routine screening begins. Direct examination of the colon should be repeated every 5 to 10 years through age 59, unless early forms of pre-cancerous polyps or small growths are found.  Hepatitis C blood testing is recommended for all people born from [birth year] through 1965 and any individual with known risks for hepatitis C.   Practice safe sex. Use condoms and avoid high-risk sexual practices to reduce the spread of sexually transmitted infections (STIs). Sexually active women aged 55 and younger should be checked for Chlamydia, which is a common sexually transmitted infection. Older women with new or multiple partners should also be tested for Chlamydia. Testing for other STIs is recommended if you are sexually active and at increased risk.   Osteoporosis is a disease in which the bones lose minerals and strength with aging. This can result in serious bone fractures. The risk of osteoporosis can be identified using a bone density scan. Women ages 16 and over and women at risk for fractures or osteoporosis should discuss screening with their caregivers. Ask your caregiver whether you should be taking a calcium supplement or vitamin D to reduce the rate of osteoporosis.   Menopause can be associated with physical symptoms and risks. Hormone  replacement therapy is available to decrease symptoms and risks. You should talk to your caregiver about whether hormone replacement therapy is right for you.   Use sunscreen with a sun protection factor (SPF) of 30 or greater. Apply sunscreen liberally and repeatedly throughout the day. You should seek shade when your shadow is shorter than you. Protect yourself by wearing long sleeves, pants, a wide-brimmed hat, and sunglasses year round, whenever you are outdoors.   Notify your caregiver of new moles or changes in moles, especially if there is a change in shape or color. Also notify your caregiver if a mole is larger than the size of a pencil eraser.   Stay current with your immunizations.  Document Released: 09/20/2010 Document Revised: 02/24/2011 Document Reviewed: 09/20/2010 [facility] Patient Information 2012 [place], [place].

## 2011-11-25 NOTE — Assessment & Plan Note (Signed)
Lab Results  Component Value Date   TSH 1.20 05/25/2011  recheck now, adjust as needed The current medical regimen is effective;  continue present plan and medications.

## 2011-11-28 ENCOUNTER — Encounter: Payer: Self-pay | Admitting: *Deleted

## 2011-12-23 ENCOUNTER — Ambulatory Visit (INDEPENDENT_AMBULATORY_CARE_PROVIDER_SITE_OTHER): Payer: 59 | Admitting: Internal Medicine

## 2011-12-23 ENCOUNTER — Encounter: Payer: Self-pay | Admitting: Internal Medicine

## 2011-12-23 VITALS — BP 98/64 | HR 69 | Temp 97.5°F

## 2011-12-23 DIAGNOSIS — F419 Anxiety disorder, unspecified: Secondary | ICD-10-CM

## 2011-12-23 DIAGNOSIS — F411 Generalized anxiety disorder: Secondary | ICD-10-CM

## 2011-12-23 DIAGNOSIS — F410 Panic disorder [episodic paroxysmal anxiety] without agoraphobia: Secondary | ICD-10-CM

## 2011-12-23 DIAGNOSIS — E039 Hypothyroidism, unspecified: Secondary | ICD-10-CM

## 2011-12-23 MED ORDER — ALPRAZOLAM 0.5 MG PO TABS: 0.5000 mg | ORAL_TABLET | Freq: Two times a day (BID) | ORAL | Status: DC | PRN

## 2011-12-23 NOTE — Progress Notes (Signed)
  Subjective:    Patient ID: Kayla Wagner, female    DOB: 1976-01-25, 36 y.o.   MRN: [id number]  HPI here for med questions -? Use of Xanax for panic attacks as needed Describes obsessive thoughts associated with palpitations, chest tightness, excess worry x72 hours precipitated by witnessed her young daughter fall down flight of stairs -daughter was unharmed, no injury or sequela of fall, but "felt unable to relax" for several days after the event Denies other depression symptoms No SI/HI  Past Medical History  Diagnosis Date  . ALLERGIC RHINITIS   . ASTHMA   . DACRYOCYSTITIS   . HYPOTHYROIDISM 2008 dx    Review of Systems  Constitutional: Positive for fatigue. Negative for fever.  Eyes: Negative for pain and visual disturbance.  Neurological: Negative for facial asymmetry and headaches.       Objective:   Physical Exam  BP 98/64  Pulse 69  Temp 97.5 F (36.4 C) (Oral)  SpO2 98%  LMP 12/16/2011 Wt Readings from Last 3 Encounters:  11/25/11 147 lb 1.9 oz (66.733 kg)  05/25/11 160 lb 12.8 oz (72.938 kg)  02/23/11 161 lb (73.029 kg)   Constitutional: She is tall/thin, but appears well-developed and well-nourished. No distress.  Neck: Normal range of motion. Neck supple. No JVD present. No thyromegaly present.  Cardiovascular: Normal rate, regular rhythm and normal heart sounds.  No murmur heard. No BLE edema. Pulmonary/Chest: Effort normal and breath sounds normal. No respiratory distress. She has no wheezes.  Psych: Slightly anxious mood and affect. Normal judgment and insight  Lab Results  Component Value Date   WBC 4.7 11/25/2011   HGB 14.3 11/25/2011   HCT 42.5 11/25/2011   PLT 212.0 11/25/2011   GLUCOSE 65* 11/25/2011   CHOL 185 11/25/2011   TRIG 135.0 11/25/2011   HDL 46.90 11/25/2011   LDLCALC 111* 11/25/2011   ALT 18 11/25/2011   AST 27 11/25/2011   NA 142 11/25/2011   K 4.2 11/25/2011   CL 108 11/25/2011   CREATININE 0.8 11/25/2011   BUN 17 11/25/2011   CO2 23 11/25/2011     TSH 1.00 11/25/2011   HGBA1C 5.0 05/25/2011       Assessment & Plan:   See problem list. Medications and labs reviewed today.

## 2011-12-23 NOTE — Patient Instructions (Signed)
It was good to see you today. We have reviewed your interval history today Okay to use low-dose generic Xanax as needed for panic attack symptoms - Your prescription(s) have been submitted to your pharmacy. Please take as directed and contact our office if you believe you are having problem(s) with the medication(s). If continued problems or worsening symptoms, call for reevaluation of medications as discussed    Anxiety and Panic Attacks Your caregiver has informed you that you are having an anxiety or panic attack. There may be many forms of this. Most of the time these attacks come suddenly and without warning. They come at any time of day, including periods of sleep, and at any time of life. They may be strong and unexplained. Although panic attacks are very scary, they are physically harmless. Sometimes the cause of your anxiety is not known. Anxiety is a protective mechanism of the body in its fight or flight mechanism. Most of these perceived danger situations are actually nonphysical situations (such as anxiety over losing a job). CAUSES   The causes of an anxiety or panic attack are many. Panic attacks may occur in otherwise healthy people given a certain set of circumstances. There may be a genetic cause for panic attacks. Some medications may also have anxiety as a side effect. SYMPTOMS   Some of the most common feelings are:  Intense terror.   Dizziness, feeling faint.   Hot and cold flashes.   Fear of going crazy.   Feelings that nothing is real.   Sweating.   Shaking.   Chest pain or a fast heartbeat (palpitations).   Smothering, choking sensations.   Feelings of impending doom and that death is near.   Tingling of extremities, this may be from over-breathing.   Altered reality (derealization).   Being detached from yourself (depersonalization).  Several symptoms can be present to make up anxiety or panic attacks. DIAGNOSIS   The evaluation by your caregiver will  depend on the type of symptoms you are experiencing. The diagnosis of anxiety or panic attack is made when no physical illness can be determined to be a cause of the symptoms. TREATMENT   Treatment to prevent anxiety and panic attacks may include:  Avoidance of circumstances that cause anxiety.   Reassurance and relaxation.   Regular exercise.   Relaxation therapies, such as yoga.   Psychotherapy with a psychiatrist or therapist.   Avoidance of caffeine, alcohol and illegal drugs.   Prescribed medication.  SEEK IMMEDIATE MEDICAL CARE IF:    You experience panic attack symptoms that are different than your usual symptoms.   You have any worsening or concerning symptoms.  Document Released: 03/07/2005 Document Revised: 05/30/2011 Document Reviewed: 07/09/2009 [facility] Patient Information 2013 [place], [place].

## 2011-12-23 NOTE — Assessment & Plan Note (Signed)
Classic symptoms periodically  most recent events precipitated by her daughter's accidental fall without injury We reviewed the role and indication for daily SSRI-type medication versus when necessary benzodiazepines Given the infrequency of episodes and generally normal mood/affect, we'll use when necessary alprazolam -new prescription today Patient to call if symptoms worse or unimproved with medical care Education on symptoms and reassurance provided for patient

## 2011-12-23 NOTE — Assessment & Plan Note (Signed)
Lab Results  Component Value Date   TSH 1.00 11/25/2011   The current medical regimen is effective;  continue present plan and medications.

## 2012-04-18 ENCOUNTER — Encounter: Payer: Self-pay | Admitting: Internal Medicine

## 2012-04-18 ENCOUNTER — Ambulatory Visit (INDEPENDENT_AMBULATORY_CARE_PROVIDER_SITE_OTHER): Payer: 59 | Admitting: Internal Medicine

## 2012-04-18 VITALS — BP 96/62 | HR 67 | Temp 97.2°F | Ht 70.0 in | Wt 145.0 lb

## 2012-04-18 DIAGNOSIS — K5900 Constipation, unspecified: Secondary | ICD-10-CM

## 2012-04-18 MED ORDER — POLYETHYLENE GLYCOL 3350 17 GM/SCOOP PO POWD: 17.0000 g | Freq: Every day | ORAL | Status: DC

## 2012-04-18 NOTE — Patient Instructions (Signed)
Fecal Impaction  A fecal impaction happens when there is a large, firm amount of stool (poop) that cannot be passed. The impacted stool is usually in the rectum, which is the lowest part of the large bowel. The impacted stool can block the colon and cause significant problems.  CAUSES   The longer stool stays in the rectum, the harder it gets. Anything that slows down your bowel movements can lead to fecal impaction. These conditions include:   Constipation (having firm hard stools). This can be a longstanding (chronic) problem, or can happen suddenly (acutely).   Painful conditions of the rectum, such as hemorrhoids or anal fissures. The pain of these conditions can make you try to avoid having bowel movements.   Narcotic pain medications cause constipation, which can sometimes be severe. If you take narcotic pain medication, you should also talk with your caregiver about preventing constipation.   Not getting enough fluids.   Inactivity and bed rest over long periods of time.  SYMPTOMS   Some symptoms of fecal impaction include:   Lack of normal bowel movements or changes in bowel patterns.   Sense of fullness in the rectum, but unable to pass stool.   Pain or cramps in the stomach or abdominal area (often after meals).   Thin, watery discharge from the rectum.  Without treatment, a fecal impaction can block the colon and cause severe abdominal pain or colon tears (perforation). This may lead to surgery.   DIAGNOSIS   Fecal impaction is suspected based upon your symptoms and upon a physical examination. This will include an exam of your rectum, which can confirm the diagnosis. Sometimes x-rays or lab testing may be needed to confirm the diagnosis and be sure there are no other problems.   TREATMENT    Initially an impaction can be removed manually. Your caregiver, using a gloved finger, can remove hard stool from your rectum.   Medication is sometimes needed. A suppository or enema can be given in the  rectum to soften the stool. This can stimulate a bowel movement. Medicines can also be given by mouth (orally).   Surgery may be needed if the colon has torn (perforated) due to blockage. This is very rare.  HOME CARE INSTRUCTIONS    Develop regular bowel habits. This may be something such as getting in the habit of having a bowel movement after your morning cup of coffee or after eating. Be sure to allow yourself enough time on the toilet.   Maintain a high fiber diet.   Drink plenty of fluids each day. This is especially true for the elderly and especially during cold weather when thirst may not be as strong. Try to take in at least eight, 8 ounce glasses of water daily unless instructed otherwise.   Exercise regularly.   If you begin to get constipated, increase the amount of fiber in your diet. Eat plenty of fruits, vegetables, whole wheat breads, bran, oatmeal and similar products.   Natural fiber laxatives such as Metamucil are also very helpful.   Speak with your caregiver if you suspect medications may be causing constipation.  SEEK MEDICAL CARE IF:    You have ongoing constipation or a hard time passing your stools.   You have ongoing rectal pain.   You require enemas or suppositories more than twice a week.  SEEK IMMEDIATE MEDICAL CARE IF:    There are continued problems or you develop abdominal pain.   You develop rectal bleeding.     You develop black or tarry stools or feel lightheaded.  MAKE SURE YOU:    Understand these instructions.   Will watch your condition.   Will get help right away if you are not doing well or get worse.  Document Released: 11/28/2003 Document Revised: 05/30/2011 Document Reviewed: 11/15/2007  [employer] Patient Information 2013 [employer]          Constipation, Adult  Constipation is when a person has fewer than 3 bowel movements a week; has difficulty having a bowel movement; or has stools that are dry, hard, or larger than normal. As people grow older,  constipation is more common. If you try to fix constipation with medicines that make you have a bowel movement (laxatives), the problem may get worse. Long-term laxative use may cause the muscles of the colon to become weak. A low-fiber diet, not taking in enough fluids, and taking certain medicines may make constipation worse.  CAUSES    Certain medicines, such as antidepressants, pain medicine, iron supplements, antacids, and water pills.    Certain diseases, such as diabetes, irritable bowel syndrome (IBS), thyroid disease, or depression.    Not drinking enough water.    Not eating enough fiber-rich foods.    Stress or travel.   Lack of physical activity or exercise.   Not going to the restroom when there is the urge to have a bowel movement.   Ignoring the urge to have a bowel movement.   Using laxatives too much.  SYMPTOMS    Having fewer than 3 bowel movements a week.    Straining to have a bowel movement.    Having hard, dry, or larger than normal stools.    Feeling full or bloated.    Pain in the lower abdomen.   Not feeling relief after having a bowel movement.  DIAGNOSIS   Your caregiver will take a medical history and perform a physical exam. Further testing may be done for severe constipation. Some tests may include:    A barium enema X-ray to examine your rectum, colon, and sometimes, your small intestine.   A sigmoidoscopy to examine your lower colon.   A colonoscopy to examine your entire colon.  TREATMENT   Treatment will depend on the severity of your constipation and what is causing it. Some dietary treatments include drinking more fluids and eating more fiber-rich foods. Lifestyle treatments may include regular exercise. If these diet and lifestyle recommendations do not help, your caregiver may recommend taking over-the-counter laxative medicines to help you have bowel movements. Prescription medicines may be prescribed if over-the-counter medicines do not work.   HOME  CARE INSTRUCTIONS    Increase dietary fiber in your diet, such as fruits, vegetables, whole grains, and beans. Limit high-fat and processed sugars in your diet, such as French fries, hamburgers, cookies, candies, and soda.    A fiber supplement may be added to your diet if you cannot get enough fiber from foods.    Drink enough fluids to keep your urine clear or pale yellow.    Exercise regularly or as directed by your caregiver.    Go to the restroom when you have the urge to go. Do not hold it.   Only take medicines as directed by your caregiver. Do not take other medicines for constipation without talking to your caregiver first.  SEEK IMMEDIATE MEDICAL CARE IF:    You have bright red blood in your stool.    Your constipation lasts for more than 4 days   or gets worse.    You have abdominal or rectal pain.    You have thin, pencil-like stools.   You have unexplained weight loss.  MAKE SURE YOU:    Understand these instructions.   Will watch your condition.   Will get help right away if you are not doing well or get worse.  Document Released: 12/04/2003 Document Revised: 05/30/2011 Document Reviewed: 02/08/2011  [employer] Patient Information 2013 [employer].

## 2012-04-19 ENCOUNTER — Encounter: Payer: Self-pay | Admitting: Internal Medicine

## 2012-04-19 NOTE — Progress Notes (Signed)
Subjective:    Patient ID: Kayla Wagner, female    DOB: [birth year], 37 y.o.   MRN: [id number]  HPI  Pt presents to the clinic today with c/o of a bowel impaction. She states that she has not had a BM in about a week. She is [redacted] weeks pregnant. She has had problems with constipation during her pregnancy but she did not really pay attention to how often she was having a BM. She did c/o sever abdominal pain yesterday. She was OOT on business. She called her husband because she was panicking and did not know what to do. After researching some information on the web, she decided to try to disimpact herself. The stool she got out was hard but did not have any blood in it. She has not had a bowel movement since but the abdominal pain has resolved. She is passing flatus.  Review of Systems      Past Medical History  Diagnosis Date  . ALLERGIC RHINITIS   . ASTHMA   . DACRYOCYSTITIS   . HYPOTHYROIDISM 2008 dx    Current Outpatient Prescriptions  Medication Sig Dispense Refill  . levothyroxine (SYNTHROID, LEVOTHROID) 75 MCG tablet Take 75 mcg by mouth daily.       . prenatal vitamin w/FE, FA (PRENATAL 1 + 1) 27-1 MG TABS Take 1 tablet by mouth daily.        Marland Kitchen ALPRAZolam (XANAX) 0.5 MG tablet Take 1 tablet (0.5 mg total) by mouth 2 (two) times daily as needed for sleep or anxiety.  30 tablet  0  . loratadine (CLARITIN) 10 MG tablet Take 10 mg by mouth daily as needed.        . polyethylene glycol powder (GLYCOLAX/MIRALAX) powder Take 17 g by mouth daily.  3350 g  1    Allergies  Allergen Reactions  . Fioricet (Butalbital-Apap-Caffeine) Hives    Hives all over body    Family History  Problem Relation Age of Onset  . Alcohol abuse Other   . Arthritis Other     grandparent  . Diabetes Father   . Hyperlipidemia Father   . Thyroid cancer Cousin 30    History   Social History  . Marital Status: Married    Spouse Name: N/A    Number of Children: N/A  . Years of Education: N/A    Occupational History  . Not on file.   Social History Main Topics  . Smoking status: Never Smoker   . Smokeless tobacco: Not on file     Comment: Married,lives with spouse and newborn dtr  . Alcohol Use: No  . Drug Use: No  . Sexually Active: Yes   Other Topics Concern  . Not on file   Social History Narrative  . No narrative on file     Constitutional: Denies fever, malaise, fatigue, headache or abrupt weight changes.  Gastrointestinal: Pt reports abdominal pain and constipation. Denies  bloating, diarrhea or blood in the stool.  GU: Denies urgency, frequency, pain with urination, burning sensation, blood in urine, odor or discharge.  No other specific complaints in a complete review of systems (except as listed in HPI above).  Objective:   Physical Exam  BP 96/62  Pulse 67  Temp 97.2 F (36.2 C) (Oral)  Ht 5\' 10"  (1.778 m)  Wt 145 lb (65.772 kg)  BMI 20.81 kg/m2  SpO2 99%  LMP 12/16/2011 Wt Readings from Last 3 Encounters:  04/18/12 145 lb (65.772 kg)  11/25/11 147 lb 1.9  oz (66.733 kg)  05/25/11 160 lb 12.8 oz (72.938 kg)    General: Appears her stated age, well developed, well nourished in NAD.  Cardiovascular: Normal rate and rhythm. S1,S2 noted.  No murmur, rubs or gallops noted. No JVD or BLE edema. No carotid bruits noted. Pulmonary/Chest: Normal effort and positive vesicular breath sounds. No respiratory distress. No wheezes, rales or ronchi noted.  Abdomen: Soft and nontender. Hypoactive bowel sounds, no bruits noted. No distention or masses noted. Liver, spleen and kidneys non palpable. Dull to percussion. Rectal exam reveals no stool in the rectal vault.         Assessment & Plan:   Constipation, new onset with additional workup required:  eRx for fleets enema every 3 days as needed until on normal bowel routine eRx for Mirilax 17 g daily Increase your fluid intake  RTC as needed or if symptoms persist

## 2012-04-28 ENCOUNTER — Other Ambulatory Visit: Payer: Self-pay | Admitting: Internal Medicine

## 2012-05-25 ENCOUNTER — Ambulatory Visit: Payer: 59 | Admitting: Internal Medicine

## 2012-07-25 ENCOUNTER — Other Ambulatory Visit: Payer: Self-pay | Admitting: *Deleted

## 2012-07-25 MED ORDER — LEVOTHYROXINE SODIUM 75 MCG PO TABS: ORAL_TABLET | ORAL | Status: DC

## 2013-07-26 ENCOUNTER — Other Ambulatory Visit (INDEPENDENT_AMBULATORY_CARE_PROVIDER_SITE_OTHER): Payer: 59

## 2013-07-26 ENCOUNTER — Encounter: Payer: Self-pay | Admitting: Internal Medicine

## 2013-07-26 ENCOUNTER — Ambulatory Visit (INDEPENDENT_AMBULATORY_CARE_PROVIDER_SITE_OTHER): Payer: 59 | Admitting: Internal Medicine

## 2013-07-26 VITALS — BP 94/70 | HR 58 | Temp 98.2°F | Resp 12 | Wt 156.0 lb

## 2013-07-26 DIAGNOSIS — E039 Hypothyroidism, unspecified: Secondary | ICD-10-CM

## 2013-07-26 DIAGNOSIS — F411 Generalized anxiety disorder: Secondary | ICD-10-CM

## 2013-07-26 DIAGNOSIS — J209 Acute bronchitis, unspecified: Secondary | ICD-10-CM

## 2013-07-26 LAB — TSH: TSH: 3.47 u[IU]/mL (ref 0.35–4.50)

## 2013-07-26 MED ORDER — AMOXICILLIN 500 MG PO CAPS: 500.0000 mg | ORAL_CAPSULE | Freq: Three times a day (TID) | ORAL | Status: DC

## 2013-07-26 MED ORDER — LEVOTHYROXINE SODIUM 75 MCG PO TABS: ORAL_TABLET | ORAL | Status: DC

## 2013-07-26 NOTE — Progress Notes (Signed)
Subjective:    Patient ID: Kayla Wagner, female    DOB: 03-06-1976, 38 y.o.   MRN: [id number]  HPI  Her history is very complex. She is concerned about her present thyroid status. To have family support she delivered her baby in [place] in August 2014. As of October she was found to be hyperthyroid. Her thyroid dose was adjusted and was therapeutic as of late November. Now she is back home w/o family support and working full-time as per the demands of her employer. She describes sensation of anxiety and some panic and "feeling overwhelmed". There's also a component of anhedonia.  She did have "baby blues" after her second child. She has taken Xanax on 2 occasions remotely with a "perfect" response  She is presently breast-feeding.   Review of Systems  Additionally she's had an upper respiratory tract infection in past 2 weeks. Last week this was manifested by some purulent nasal secretions as well as productive cough.  At this time she denies frontal headache, facial pain, nasal purulence, fever, chills, or sweats. She has no sore throat or dental pain at this time  She continues to have a lingering cough with some yellow sputum.         Objective:   Physical Exam Gen.: Thin but healthy and well-nourished in appearance. Alert, appropriate and cooperative throughout exam. Appears younger than stated age  Head: Normocephalic without obvious abnormalities  Eyes: No corneal or conjunctival inflammation noted. Pupils equal round reactive to light and accommodation. Extraocular motion intact. No lid lag or proptosis. Ears: External  ear exam reveals no significant lesions or deformities. Canals clear .TMs normal. Hearing is grossly normal bilaterally. Nose: External nasal exam reveals no deformity or inflammation. Nasal mucosa are pink and moist. No lesions or exudates noted.   Mouth: Oral mucosa and oropharynx reveal no lesions or exudates. Teeth in good repair. Neck: No  deformities, masses, or tenderness noted. Range of motion & Thyroid normal Lungs: Normal respiratory effort; chest expands symmetrically. Lungs are clear to auscultation without rales, wheezes, or increased work of breathing. Minor loose cough present Heart: Normal rate and rhythm. Normal S1 and S2. No gallop, click, or rub. No murmur. Abdomen: Bowel sounds normal; abdomen soft and nontender. No masses, organomegaly or hernias noted.                                 Musculoskeletal/extremities: No deformity or scoliosis noted of  the thoracic or lumbar spine.  No clubbing, cyanosis, edema, or significant extremity  deformity noted. Range of motion normal .Tone & strength normal. Hand joints normal. Fingernail  health good. No onycholysis Able to lie down & sit up w/o help. Negative SLR bilaterally Vascular: Carotid, radial artery, dorsalis pedis and  posterior tibial pulses are full and equal. No bruits present. Neurologic: Alert and oriented x3. Deep tendon reflexes symmetrical and normal. No tremor Gait normal .     Skin: Intact without suspicious lesions or rashes. Lymph: No cervical, axillary lymphadenopathy present. Psych: Mood and affect are normal. Normally interactive  . She is intelligent and appropriate.  She exhibits realty testing as she discusses her present situation.         Assessment & Plan:  #1 hypothyroidism,  status needs to be reassessed  #2 acute bronchitis; upper resp tract symptoms have essentially resolved  #3 anxiety disorder, postpartum  Plan: See orders. Neurotransmitter deficiency was discussed with her. I  will research literature to see what are the most reasonable options in view of her symptoms, postpartum state, and breast-feeding status. I recommended she discuss status with her Ob/Gyn physician

## 2013-07-26 NOTE — Patient Instructions (Signed)
Plain Mucinex (NOT D) for thick secretions ;force NON dairy fluids .   Nasal cleansing in the shower as discussed with lather of mild shampoo.After 10 seconds wash off lather while  exhaling through nostrils. Make sure that all residual soap is removed to prevent irritation.  Flonase OR Nasacort AQ 1 spray in each nostril twice a day as needed. Use the "crossover" technique into opposite nostril spraying toward opposite ear @ 45 degree angle, not straight up into nostril.  Use a Neti pot daily only  as needed for significant sinus congestion; going from open side to congested side .

## 2013-07-26 NOTE — Progress Notes (Signed)
Pre visit review using our clinic review tool, if applicable. No additional management support is needed unless otherwise documented below in the visit note. 

## 2013-07-27 ENCOUNTER — Encounter: Payer: Self-pay | Admitting: Internal Medicine

## 2013-07-29 ENCOUNTER — Other Ambulatory Visit: Payer: Self-pay | Admitting: Internal Medicine

## 2013-07-29 MED ORDER — SERTRALINE HCL 50 MG PO TABS
50.0000 mg | ORAL_TABLET | Freq: Every day | ORAL | Status: DC
Start: 2013-07-29 — End: 2014-04-07

## 2013-10-03 ENCOUNTER — Telehealth: Payer: Self-pay | Admitting: Internal Medicine

## 2013-10-03 DIAGNOSIS — E039 Hypothyroidism, unspecified: Secondary | ICD-10-CM

## 2013-10-03 DIAGNOSIS — Z0000 Encounter for general adult medical examination without abnormal findings: Secondary | ICD-10-CM

## 2013-10-03 NOTE — Telephone Encounter (Signed)
Called pt to inform that request for labs are being sent to [facility].

## 2013-10-03 NOTE — Telephone Encounter (Signed)
Patient states she needs to have labs done for thyroid.  Dr. Katheren Puller first available appointment is a month out.  Patient would like to know if she could just come in for labs or can I schedule with a different provider?  Thanks!

## 2013-10-04 ENCOUNTER — Telehealth: Payer: Self-pay

## 2013-10-04 DIAGNOSIS — Z0000 Encounter for general adult medical examination without abnormal findings: Secondary | ICD-10-CM

## 2013-10-04 DIAGNOSIS — E039 Hypothyroidism, unspecified: Secondary | ICD-10-CM

## 2013-10-04 NOTE — Telephone Encounter (Signed)
Order for repeat TSH placed

## 2013-10-04 NOTE — Telephone Encounter (Signed)
Actually, TSH just done 07/2013 so no need to repeat Please schedule for my next available CPX - pt can come in between now and then for CPX labs (order done) - ok to fill all meds as rx'd until the OV can occur thanks

## 2013-10-07 NOTE — Telephone Encounter (Signed)
Called pt and informed that labs were ordered. Pt states they will be in tomorrow morning to complete.

## 2013-10-08 ENCOUNTER — Ambulatory Visit: Payer: 59 | Admitting: Internal Medicine

## 2013-10-31 ENCOUNTER — Other Ambulatory Visit (INDEPENDENT_AMBULATORY_CARE_PROVIDER_SITE_OTHER): Payer: 59

## 2013-10-31 DIAGNOSIS — Z0000 Encounter for general adult medical examination without abnormal findings: Secondary | ICD-10-CM

## 2013-10-31 DIAGNOSIS — E039 Hypothyroidism, unspecified: Secondary | ICD-10-CM

## 2013-10-31 LAB — LIPID PANEL
Cholesterol: 190 mg/dL (ref 0–200)
HDL: 52.9 mg/dL (ref >39.00)
LDL Cholesterol: 128 mg/dL — ABNORMAL HIGH (ref 0–99)
NonHDL: 137.1
TRIGLYCERIDES: 46 mg/dL (ref 0.0–149.0)
Total CHOL/HDL Ratio: 4
VLDL: 9.2 mg/dL (ref 0.0–40.0)

## 2013-10-31 LAB — CBC WITH DIFFERENTIAL/PLATELET
BASOS PCT: 0.6 % (ref 0.0–3.0)
Basophils Absolute: 0 10*3/uL (ref 0.0–0.1)
EOS ABS: 0.2 10*3/uL (ref 0.0–0.7)
Eosinophils Relative: 4.3 % (ref 0.0–5.0)
HCT: 41.8 % (ref 36.0–46.0)
HEMOGLOBIN: 14 g/dL (ref 12.0–15.0)
Lymphocytes Relative: 39.6 % (ref 12.0–46.0)
Lymphs Abs: 1.5 10*3/uL (ref 0.7–4.0)
MCHC: 33.4 g/dL (ref 30.0–36.0)
MCV: 91.7 fl (ref 78.0–100.0)
MONO ABS: 0.5 10*3/uL (ref 0.1–1.0)
Monocytes Relative: 11.9 % (ref 3.0–12.0)
Neutro Abs: 1.7 10*3/uL (ref 1.4–7.7)
Neutrophils Relative %: 43.6 % (ref 43.0–77.0)
Platelets: 221 10*3/uL (ref 150.0–400.0)
RBC: 4.55 Mil/uL (ref 3.87–5.11)
RDW: 13.1 % (ref 11.5–15.5)
WBC: 3.8 10*3/uL — ABNORMAL LOW (ref 4.0–10.5)

## 2013-10-31 LAB — URINALYSIS, ROUTINE W REFLEX MICROSCOPIC
Bilirubin Urine: NEGATIVE
Hgb urine dipstick: NEGATIVE
Ketones, ur: NEGATIVE
Nitrite: NEGATIVE
TOTAL PROTEIN, URINE-UPE24: NEGATIVE
UROBILINOGEN UA: 0.2 (ref 0.0–1.0)
Urine Glucose: NEGATIVE
pH: 7 (ref 5.0–8.0)

## 2013-10-31 LAB — TSH: TSH: 3.57 u[IU]/mL (ref 0.35–4.50)

## 2013-10-31 LAB — BASIC METABOLIC PANEL
BUN: 11 mg/dL (ref 6–23)
CHLORIDE: 105 meq/L (ref 96–112)
CO2: 27 meq/L (ref 19–32)
Calcium: 9.5 mg/dL (ref 8.4–10.5)
Creatinine, Ser: 1 mg/dL (ref 0.4–1.2)
GFR: 66.05 mL/min (ref >60.00)
Glucose, Bld: 76 mg/dL (ref 70–99)
Potassium: 4 mEq/L (ref 3.5–5.1)
Sodium: 139 mEq/L (ref 135–145)

## 2013-10-31 LAB — HEPATIC FUNCTION PANEL
ALK PHOS: 51 U/L (ref 39–117)
ALT: 19 U/L (ref 0–35)
AST: 24 U/L (ref 0–37)
Albumin: 4.5 g/dL (ref 3.5–5.2)
Bilirubin, Direct: 0.1 mg/dL (ref 0.0–0.3)
TOTAL PROTEIN: 7.3 g/dL (ref 6.0–8.3)
Total Bilirubin: 0.8 mg/dL (ref 0.2–1.2)

## 2013-11-27 ENCOUNTER — Encounter: Payer: Self-pay | Admitting: Internal Medicine

## 2014-01-20 ENCOUNTER — Encounter: Payer: Self-pay | Admitting: Internal Medicine

## 2014-04-07 ENCOUNTER — Ambulatory Visit (INDEPENDENT_AMBULATORY_CARE_PROVIDER_SITE_OTHER): Payer: 59 | Admitting: Internal Medicine

## 2014-04-07 ENCOUNTER — Other Ambulatory Visit (INDEPENDENT_AMBULATORY_CARE_PROVIDER_SITE_OTHER): Payer: 59

## 2014-04-07 ENCOUNTER — Encounter: Payer: Self-pay | Admitting: Internal Medicine

## 2014-04-07 VITALS — BP 110/62 | HR 63 | Temp 97.7°F | Resp 14 | Ht 70.0 in | Wt 146.0 lb

## 2014-04-07 DIAGNOSIS — E039 Hypothyroidism, unspecified: Secondary | ICD-10-CM

## 2014-04-07 DIAGNOSIS — N939 Abnormal uterine and vaginal bleeding, unspecified: Secondary | ICD-10-CM | POA: Insufficient documentation

## 2014-04-07 DIAGNOSIS — J45909 Unspecified asthma, uncomplicated: Secondary | ICD-10-CM

## 2014-04-07 LAB — TSH: TSH: 4.28 u[IU]/mL (ref 0.35–4.50)

## 2014-04-07 MED ORDER — LEVONORGESTREL-ETHINYL ESTRAD 0.1-20 MG-MCG PO TABS: 1.0000 | ORAL_TABLET | Freq: Every day | ORAL | Status: DC

## 2014-04-07 NOTE — Assessment & Plan Note (Signed)
Irregular cycles with consistent bleeding since stopping breastfeeding. Will rx alesse for her. She is due to see her ob/gyn for yearly visit in 3 weeks and if no improvement warrants further treatment.

## 2014-04-07 NOTE — Progress Notes (Signed)
   Subjective:    Patient ID: Kayla Wagner, female    DOB: 1975-07-06, 39 y.o.   MRN: [id number]  HPI The patient is a 39 YO female who is coming in today for yearly physical. She does have PMH of hypothyroidism. She stopped breastfeeding in November and now is having irregular periods with bleeding almost daily varying from heavy to spotting. She will occasionally have 3-5 days without bleeding. She is taking a mini-pill right now and wants to get back on regular birth control, she called her ob/gyn about it but was misunderstood and they just called in a refill of her current medication. She also wants to make sure that her thyroid is okay as sometimes pregnancy affects her levels. She denies chest pains, SOB, abdominal pain, joint pain. She has had a cold the last month or so with the children being sick but is now getting better. She had stopped taking zoloft as it gave her headaches. She stopped the medicine and started running again and is doing well with her mood.   Review of Systems  Constitutional: Negative for fever, activity change, appetite change, fatigue and unexpected weight change.  HENT: Negative.   Respiratory: Negative for cough, chest tightness, shortness of breath and wheezing.   Cardiovascular: Negative for chest pain, palpitations and leg swelling.  Gastrointestinal: Negative for nausea, abdominal pain, diarrhea, constipation and abdominal distention.  Genitourinary: Positive for vaginal bleeding.  Musculoskeletal: Negative.   Skin: Negative.   Neurological: Negative.   Psychiatric/Behavioral: Negative for dysphoric mood, decreased concentration and agitation.      Objective:   Physical Exam  Constitutional: She is oriented to person, place, and time. She appears well-developed and well-nourished.  slim  HENT:  Head: Normocephalic and atraumatic.  Eyes: EOM are normal.  Neck: Normal range of motion.  Cardiovascular: Normal rate and regular rhythm.     Pulmonary/Chest: Effort normal and breath sounds normal. No respiratory distress. She has no wheezes. She has no rales.  Abdominal: Soft. Bowel sounds are normal. She exhibits no distension. There is no tenderness. There is no rebound.  Musculoskeletal: She exhibits no edema.  Neurological: She is alert and oriented to person, place, and time. Coordination normal.  Skin: Skin is warm and dry.  Psychiatric: She has a normal mood and affect. Her behavior is normal.   Filed Vitals:   04/07/14 0833  BP: 110/62  Pulse: 63  Temp: 97.7 F (36.5 C)  TempSrc: Oral  Resp: 14  Height: 5\' 10"  (1.778 m)  Weight: 146 lb (66.225 kg)  SpO2: 99%      Assessment & Plan:

## 2014-04-07 NOTE — Assessment & Plan Note (Signed)
Check TSH and adjust dosing as needed.

## 2014-04-07 NOTE — Patient Instructions (Signed)
We have sent in the alesse birth control for you to try and will forward a message to your Ob/Gyn Dr. Harrington Challenger and let them know.   We will check your thyroid levels today and call you back if there are any changes needed to your medicine.

## 2014-04-07 NOTE — Progress Notes (Signed)
Pre visit review using our clinic review tool, if applicable. No additional management support is needed unless otherwise documented below in the visit note. 

## 2014-04-07 NOTE — Assessment & Plan Note (Signed)
Patient declines any breathing problems and has no inhalers.

## 2014-04-08 ENCOUNTER — Encounter: Payer: Self-pay | Admitting: Internal Medicine

## 2014-04-10 MED ORDER — LEVOTHYROXINE SODIUM 75 MCG PO TABS: 75.0000 ug | ORAL_TABLET | Freq: Every day | ORAL | Status: DC

## 2014-04-10 MED ORDER — LEVOTHYROXINE SODIUM 88 MCG PO TABS: 88.0000 ug | ORAL_TABLET | ORAL | Status: DC

## 2014-04-28 ENCOUNTER — Other Ambulatory Visit: Payer: Self-pay | Admitting: Obstetrics and Gynecology

## 2014-04-29 LAB — CYTOLOGY - PAP

## 2015-04-06 ENCOUNTER — Encounter: Payer: Self-pay | Admitting: Family

## 2015-04-06 ENCOUNTER — Ambulatory Visit (INDEPENDENT_AMBULATORY_CARE_PROVIDER_SITE_OTHER): Payer: 59 | Admitting: Family

## 2015-04-06 ENCOUNTER — Other Ambulatory Visit (INDEPENDENT_AMBULATORY_CARE_PROVIDER_SITE_OTHER): Payer: 59

## 2015-04-06 VITALS — BP 132/88 | HR 73 | Temp 97.6°F | Resp 18 | Ht 70.0 in | Wt 149.0 lb

## 2015-04-06 DIAGNOSIS — B3731 Acute candidiasis of vulva and vagina: Secondary | ICD-10-CM

## 2015-04-06 DIAGNOSIS — R05 Cough: Secondary | ICD-10-CM | POA: Diagnosis not present

## 2015-04-06 DIAGNOSIS — R5383 Other fatigue: Secondary | ICD-10-CM | POA: Diagnosis not present

## 2015-04-06 DIAGNOSIS — R059 Cough, unspecified: Secondary | ICD-10-CM

## 2015-04-06 DIAGNOSIS — E039 Hypothyroidism, unspecified: Secondary | ICD-10-CM

## 2015-04-06 DIAGNOSIS — B373 Candidiasis of vulva and vagina: Secondary | ICD-10-CM

## 2015-04-06 LAB — COMPREHENSIVE METABOLIC PANEL
ALBUMIN: 4.2 g/dL (ref 3.5–5.2)
ALK PHOS: 39 U/L (ref 39–117)
ALT: 16 U/L (ref 0–35)
AST: 20 U/L (ref 0–37)
BILIRUBIN TOTAL: 0.5 mg/dL (ref 0.2–1.2)
BUN: 7 mg/dL (ref 6–23)
CALCIUM: 9 mg/dL (ref 8.4–10.5)
CO2: 23 mEq/L (ref 19–32)
CREATININE: 0.74 mg/dL (ref 0.40–1.20)
Chloride: 108 mEq/L (ref 96–112)
GFR: 92.79 mL/min (ref >60.00)
Glucose, Bld: 89 mg/dL (ref 70–99)
Potassium: 3.9 mEq/L (ref 3.5–5.1)
Sodium: 141 mEq/L (ref 135–145)
TOTAL PROTEIN: 7 g/dL (ref 6.0–8.3)

## 2015-04-06 LAB — IBC PANEL
IRON: 103 ug/dL (ref 42–145)
Saturation Ratios: 24.9 % (ref 20.0–50.0)
TRANSFERRIN: 295 mg/dL (ref 212.0–360.0)

## 2015-04-06 LAB — CBC
HCT: 40.5 % (ref 36.0–46.0)
Hemoglobin: 13.5 g/dL (ref 12.0–15.0)
MCHC: 33.3 g/dL (ref 30.0–36.0)
MCV: 91.4 fl (ref 78.0–100.0)
PLATELETS: 268 10*3/uL (ref 150.0–400.0)
RBC: 4.44 Mil/uL (ref 3.87–5.11)
RDW: 12.7 % (ref 11.5–15.5)
WBC: 4.8 10*3/uL (ref 4.0–10.5)

## 2015-04-06 LAB — FERRITIN: Ferritin: 65.2 ng/mL (ref 10.0–291.0)

## 2015-04-06 MED ORDER — FLUCONAZOLE 150 MG PO TABS: ORAL_TABLET | ORAL | Status: DC

## 2015-04-06 NOTE — Progress Notes (Signed)
Subjective:    Patient ID: Kayla Wagner, female    DOB: 07-09-1975, 40 y.o.   MRN: [id number]  Chief Complaint  Patient presents with  . Cough    since thankgiving has had a sinus infection, ear infection and bronchitis, has a cough that has been causing chest pain, still has alot of drainage, has yeast infection from all the antibiotics     HPI:  Kayla Wagner is a 40 y.o. female who  has a past medical history of ALLERGIC RHINITIS; ASTHMA; DACRYOCYSTITIS; and HYPOTHYROIDISM (2008 dx). and presents today for a follow up office visit.  Has noted that she has had several infections over the past couple of months including a sinus infection, ear infection and bronchitis. Currently experiencing the associated symptom of a cough that is resulting in chest pain and a significant amount of drainage. Denies fevers.  Modifying factors include antibiotics prescribed by Urgent Care. Courses of completed antibiotics include doxycycline and Augmentin. She also did a prednisone taper. She has also experience the side effect of antibiotic treatment and the new problem of a yeast infection located in the vulvovaginal area. . Modifying factors include Sudafed, Zycam, and Mucinex which has helped minimally. The course of the symptoms has slightly improving for the last week. She has also been experiencing the associated symptom of fatigue.   Allergies  Allergen Reactions  . Fioricet [Butalbital-Apap-Caffeine] Hives    Hives all over body     Current Outpatient Prescriptions on File Prior to Visit  Medication Sig Dispense Refill  . ERRIN 0.35 MG tablet Take 1 tablet by mouth daily.     Marland Kitchen levonorgestrel-ethinyl estradiol (AVIANE,ALESSE,LESSINA) 0.1-20 MG-MCG tablet Take 1 tablet by mouth daily. 1 Package 11  . prenatal vitamin w/FE, FA (PRENATAL 1 + 1) 27-1 MG TABS Take 1 tablet by mouth daily.       No current facility-administered medications on file prior to visit.    Review of  Systems  Constitutional: Negative for fever and chills.  HENT: Positive for congestion. Negative for sinus pressure and sore throat.   Respiratory: Positive for cough. Negative for chest tightness and shortness of breath.   Cardiovascular: Negative for chest pain, palpitations and leg swelling.  Neurological: Negative for headaches.      Objective:    BP 132/88 mmHg  Pulse 73  Temp(Src) 97.6 F (36.4 C) (Oral)  Resp 18  Ht 5\' 10"  (1.778 m)  Wt 149 lb (67.586 kg)  BMI 21.38 kg/m2  SpO2 99% Nursing note and vital signs reviewed.  Physical Exam  Constitutional: She is oriented to person, place, and time. She appears well-developed and well-nourished. No distress.  HENT:  Right Ear: Hearing, tympanic membrane, external ear and ear canal normal.  Left Ear: Hearing, tympanic membrane, external ear and ear canal normal.  Nose: Nose normal. Right sinus exhibits no maxillary sinus tenderness and no frontal sinus tenderness. Left sinus exhibits no maxillary sinus tenderness and no frontal sinus tenderness.  Mouth/Throat: Uvula is midline, oropharynx is clear and moist and mucous membranes are normal.  Cardiovascular: Normal rate, regular rhythm, normal heart sounds and intact distal pulses.   Pulmonary/Chest: Effort normal and breath sounds normal. She has no wheezes. She has no rales. She exhibits no tenderness.  Neurological: She is alert and oriented to person, place, and time.  Skin: Skin is warm and dry. No pallor.  Psychiatric: She has a normal mood and affect. Her behavior is normal. Judgment and thought content normal.  Assessment & Plan:   Problem List Items Addressed This Visit      Endocrine   Hypothyroidism - Primary    Hypothyroidism is controlled with levothyroxine and currently being maintained by endocrinology. Takes medication as prescribed and denies adverse side effects. Continue current dosage of levothyroxine and follow-up with endocrinology as scheduled.        Relevant Medications   levothyroxine (SYNTHROID, LEVOTHROID) 112 MCG tablet     Genitourinary   Vulvovaginal candidiasis    Symptoms consistent with vulvovaginal candidiasis secondary to antibiotic treatment. Start Diflucan. Follow-up if symptoms worsen or do not improve.      Relevant Medications   fluconazole (DIFLUCAN) 150 MG tablet     Other   Cough    Cough most likely related to resolving bronchitis as over the past week there has been significant improvement. Recommend conservative treatment at this time with over-the-counter medications as needed for symptom relief and supportive care. No indication for any prescription medications currently. Follow-up if symptoms worsen or do not improve.      Fatigue    Symptoms of fatigue most likely related to post viral syndrome and illness within the past 6 weeks. TSH per patient is within the normal ranges and follows up with endocrinology in one month. Cannot rule out increased bleeding from menstruation as part of her fatigue. Obtain CBC, ferritin, comprehensive metabolic panel, and IBC panel. Follow-up pending blood work results.      Relevant Orders   CBC   Comprehensive metabolic panel   IBC panel   Ferritin

## 2015-04-06 NOTE — Assessment & Plan Note (Signed)
Hypothyroidism is controlled with levothyroxine and currently being maintained by endocrinology. Takes medication as prescribed and denies adverse side effects. Continue current dosage of levothyroxine and follow-up with endocrinology as scheduled.

## 2015-04-06 NOTE — Assessment & Plan Note (Signed)
Cough most likely related to resolving bronchitis as over the past week there has been significant improvement. Recommend conservative treatment at this time with over-the-counter medications as needed for symptom relief and supportive care. No indication for any prescription medications currently. Follow-up if symptoms worsen or do not improve.

## 2015-04-06 NOTE — Assessment & Plan Note (Signed)
Symptoms consistent with vulvovaginal candidiasis secondary to antibiotic treatment. Start Diflucan. Follow-up if symptoms worsen or do not improve.

## 2015-04-06 NOTE — Patient Instructions (Addendum)
Thank you for choosing [facility].  Summary/Instructions:  Your prescription(s) have been submitted to your pharmacy or been printed and provided for you. Please take as directed and contact our office if you believe you are having problem(s) with the medication(s) or have any questions.  Please stop by the lab on the basement level of the building for your blood work. Your results will be released to [place] (or called to you) after review, usually within 72 hours after test completion. If any changes need to be made, you will be notified at that same time.  If your symptoms worsen or fail to improve, please contact our office for further instruction, or in case of emergency go directly to the emergency room at the closest medical facility.   Get plenty of rest. Continue over the counter medications as needed for symptom relief.

## 2015-04-06 NOTE — Assessment & Plan Note (Signed)
Symptoms of fatigue most likely related to post viral syndrome and illness within the past 6 weeks. TSH per patient is within the normal ranges and follows up with endocrinology in one month. Cannot rule out increased bleeding from menstruation as part of her fatigue. Obtain CBC, ferritin, comprehensive metabolic panel, and IBC panel. Follow-up pending blood work results.

## 2015-04-06 NOTE — Progress Notes (Signed)
Pre visit review using our clinic review tool, if applicable. No additional management support is needed unless otherwise documented below in the visit note. 

## 2015-05-29 ENCOUNTER — Ambulatory Visit (INDEPENDENT_AMBULATORY_CARE_PROVIDER_SITE_OTHER): Payer: 59 | Admitting: Internal Medicine

## 2015-05-29 ENCOUNTER — Other Ambulatory Visit (INDEPENDENT_AMBULATORY_CARE_PROVIDER_SITE_OTHER): Payer: 59

## 2015-05-29 ENCOUNTER — Encounter: Payer: Self-pay | Admitting: Internal Medicine

## 2015-05-29 VITALS — BP 102/70 | HR 73 | Temp 98.3°F | Resp 14 | Ht 70.0 in | Wt 153.0 lb

## 2015-05-29 DIAGNOSIS — Z0000 Encounter for general adult medical examination without abnormal findings: Secondary | ICD-10-CM | POA: Insufficient documentation

## 2015-05-29 LAB — LIPID PANEL
CHOL/HDL RATIO: 2
Cholesterol: 153 mg/dL (ref 0–200)
HDL: 64.7 mg/dL (ref >39.00)
LDL CALC: 72 mg/dL (ref 0–99)
NonHDL: 87.83
Triglycerides: 77 mg/dL (ref 0.0–149.0)
VLDL: 15.4 mg/dL (ref 0.0–40.0)

## 2015-05-29 NOTE — Progress Notes (Signed)
Pre visit review using our clinic review tool, if applicable. No additional management support is needed unless otherwise documented below in the visit note. 

## 2015-05-29 NOTE — Progress Notes (Signed)
   Subjective:    Patient ID: Kayla Wagner, female    DOB: [birth year], 40 y.o.   MRN: [id number]  HPI The patient is a 40 YO female coming in for wellness. No new problems. Non-smoker and exercises regularly.   PMH, [facility], social history reviewed and updated.   Review of Systems  Constitutional: Negative for fever, activity change, appetite change, fatigue and unexpected weight change.  HENT: Negative.   Eyes: Negative.   Respiratory: Negative for cough, chest tightness, shortness of breath and wheezing.   Cardiovascular: Negative for chest pain, palpitations and leg swelling.  Gastrointestinal: Negative for nausea, abdominal pain, diarrhea, constipation and abdominal distention.  Musculoskeletal: Negative.   Skin: Negative.   Neurological: Negative.   Psychiatric/Behavioral: Negative for dysphoric mood, decreased concentration and agitation.      Objective:   Physical Exam  Constitutional: She is oriented to person, place, and time. She appears well-developed and well-nourished.  slim  HENT:  Head: Normocephalic and atraumatic.  Eyes: EOM are normal.  Neck: Normal range of motion.  Cardiovascular: Normal rate and regular rhythm.   Pulmonary/Chest: Effort normal and breath sounds normal. No respiratory distress. She has no wheezes. She has no rales.  Abdominal: Soft. Bowel sounds are normal. She exhibits no distension. There is no tenderness. There is no rebound.  Musculoskeletal: She exhibits no edema.  Neurological: She is alert and oriented to person, place, and time. Coordination normal.  Skin: Skin is warm and dry.  Psychiatric: She has a normal mood and affect.   Filed Vitals:   05/29/15 0821  BP: 102/70  Pulse: 73  Temp: 98.3 F (36.8 C)  TempSrc: Oral  Resp: 14  Height: 5\' 10"  (1.778 m)  Weight: 153 lb (69.4 kg)  SpO2: 98%      Assessment & Plan:

## 2015-05-29 NOTE — Patient Instructions (Signed)
We will check the cholesterol today and call you back with the results.  Keep up the good work with the exercise and stretching.   Come back in about 1 year and sooner if you have any problems or questions.  Health Maintenance, Female Adopting a healthy lifestyle and getting preventive care can go a long way to promote health and wellness. Talk with your health care provider about what schedule of regular examinations is right for you. This is a good chance for you to check in with your provider about disease prevention and staying healthy. In between checkups, there are plenty of things you can do on your own. Experts have done a lot of research about which lifestyle changes and preventive measures are most likely to keep you healthy. Ask your health care provider for more information. WEIGHT AND DIET  Eat a healthy diet  Be sure to include plenty of vegetables, fruits, low-fat dairy products, and lean protein.  Do not eat a lot of foods high in solid fats, added sugars, or salt.  Get regular exercise. This is one of the most important things you can do for your health.  Most adults should exercise for at least 150 minutes each week. The exercise should increase your heart rate and make you sweat (moderate-intensity exercise).  Most adults should also do strengthening exercises at least twice a week. This is in addition to the moderate-intensity exercise.  Maintain a healthy weight  Body mass index (BMI) is a measurement that can be used to identify possible weight problems. It estimates body fat based on height and weight. Your health care provider can help determine your BMI and help you achieve or maintain a healthy weight.  For females 110 years of age and older:   A BMI below 18.5 is considered underweight.  A BMI of 18.5 to 24.9 is normal.  A BMI of 25 to 29.9 is considered overweight.  A BMI of 30 and above is considered obese.  Watch levels of cholesterol and blood  lipids  You should start having your blood tested for lipids and cholesterol at 40 years of age, then have this test every 5 years.  You may need to have your cholesterol levels checked more often if:  Your lipid or cholesterol levels are high.  You are older than 40 years of age.  You are at high risk for heart disease.  CANCER SCREENING   Lung Cancer  Lung cancer screening is recommended for adults 50-40 years old who are at high risk for lung cancer because of a history of smoking.  A yearly low-dose CT scan of the lungs is recommended for people who:  Currently smoke.  Have quit within the past 15 years.  Have at least a 30-pack-year history of smoking. A pack year is smoking an average of one pack of cigarettes a day for 1 year.  Yearly screening should continue until it has been 15 years since you quit.  Yearly screening should stop if you develop a health problem that would prevent you from having lung cancer treatment.  Breast Cancer  Practice breast self-awareness. This means understanding how your breasts normally appear and feel.  It also means doing regular breast self-exams. Let your health care provider know about any changes, no matter how small.  If you are in your 20s or 30s, you should have a clinical breast exam (CBE) by a health care provider every 1-3 years as part of a regular health exam.  If you are 40 or older, have a CBE every year. Also consider having a breast X-ray (mammogram) every year.  If you have a family history of breast cancer, talk to your health care provider about genetic screening.  If you are at high risk for breast cancer, talk to your health care provider about having an MRI and a mammogram every year.  Breast cancer gene (BRCA) assessment is recommended for women who have family members with BRCA-related cancers. BRCA-related cancers include:  Breast.  Ovarian.  Tubal.  Peritoneal cancers.  Results of the assessment  will determine the need for genetic counseling and BRCA1 and BRCA2 testing. Cervical Cancer Your health care provider may recommend that you be screened regularly for cancer of the pelvic organs (ovaries, uterus, and vagina). This screening involves a pelvic examination, including checking for microscopic changes to the surface of your cervix (Pap test). You may be encouraged to have this screening done every 3 years, beginning at age 24.  For women ages 59-65, health care providers may recommend pelvic exams and Pap testing every 3 years, or they may recommend the Pap and pelvic exam, combined with testing for human papilloma virus (HPV), every 5 years. Some types of HPV increase your risk of cervical cancer. Testing for HPV may also be done on women of any age with unclear Pap test results.  Other health care providers may not recommend any screening for nonpregnant women who are considered low risk for pelvic cancer and who do not have symptoms. Ask your health care provider if a screening pelvic exam is right for you.  If you have had past treatment for cervical cancer or a condition that could lead to cancer, you need Pap tests and screening for cancer for at least 20 years after your treatment. If Pap tests have been discontinued, your risk factors (such as having a new sexual partner) need to be reassessed to determine if screening should resume. Some women have medical problems that increase the chance of getting cervical cancer. In these cases, your health care provider may recommend more frequent screening and Pap tests. Colorectal Cancer  This type of cancer can be detected and often prevented.  Routine colorectal cancer screening usually begins at 40 years of age and continues through 40 years of age.  Your health care provider may recommend screening at an earlier age if you have risk factors for colon cancer.  Your health care provider may also recommend using home test kits to check  for hidden blood in the stool.  A small camera at the end of a tube can be used to examine your colon directly (sigmoidoscopy or colonoscopy). This is done to check for the earliest forms of colorectal cancer.  Routine screening usually begins at age 26.  Direct examination of the colon should be repeated every 5-10 years through 40 years of age. However, you may need to be screened more often if early forms of precancerous polyps or small growths are found. Skin Cancer  Check your skin from head to toe regularly.  Tell your health care provider about any new moles or changes in moles, especially if there is a change in a mole's shape or color.  Also tell your health care provider if you have a mole that is larger than the size of a pencil eraser.  Always use sunscreen. Apply sunscreen liberally and repeatedly throughout the day.  Protect yourself by wearing long sleeves, pants, a wide-brimmed hat, and sunglasses whenever you  are outside. HEART DISEASE, DIABETES, AND HIGH BLOOD PRESSURE   High blood pressure causes heart disease and increases the risk of stroke. High blood pressure is more likely to develop in:  People who have blood pressure in the high end of the normal range (130-139/85-89 mm Hg).  People who are overweight or obese.  People who are African American.  If you are 18-39 years of age, have your blood pressure checked every 3-5 years. If you are 40 years of age or older, have your blood pressure checked every year. You should have your blood pressure measured twice--once when you are at a hospital or clinic, and once when you are not at a hospital or clinic. Record the average of the two measurements. To check your blood pressure when you are not at a hospital or clinic, you can use:  An automated blood pressure machine at a pharmacy.  A home blood pressure monitor.  If you are between 55 years and 79 years old, ask your health care provider if you should take  aspirin to prevent strokes.  Have regular diabetes screenings. This involves taking a blood sample to check your fasting blood sugar level.  If you are at a normal weight and have a low risk for diabetes, have this test once every three years after 40 years of age.  If you are overweight and have a high risk for diabetes, consider being tested at a younger age or more often. PREVENTING INFECTION  Hepatitis B  If you have a higher risk for hepatitis B, you should be screened for this virus. You are considered at high risk for hepatitis B if:  You were born in a country where hepatitis B is common. Ask your health care provider which countries are considered high risk.  Your parents were born in a high-risk country, and you have not been immunized against hepatitis B (hepatitis B vaccine).  You have HIV or AIDS.  You use needles to inject street drugs.  You live with someone who has hepatitis B.  You have had sex with someone who has hepatitis B.  You get hemodialysis treatment.  You take certain medicines for conditions, including cancer, organ transplantation, and autoimmune conditions. Hepatitis C  Blood testing is recommended for:  Everyone born from 1945 through 1965.  Anyone with known risk factors for hepatitis C. Sexually transmitted infections (STIs)  You should be screened for sexually transmitted infections (STIs) including gonorrhea and chlamydia if:  You are sexually active and are younger than 40 years of age.  You are older than 40 years of age and your health care provider tells you that you are at risk for this type of infection.  Your sexual activity has changed since you were last screened and you are at an increased risk for chlamydia or gonorrhea. Ask your health care provider if you are at risk.  If you do not have HIV, but are at risk, it may be recommended that you take a prescription medicine daily to prevent HIV infection. This is called  pre-exposure prophylaxis (PrEP). You are considered at risk if:  You are sexually active and do not regularly use condoms or know the HIV status of your partner(s).  You take drugs by injection.  You are sexually active with a partner who has HIV. Talk with your health care provider about whether you are at high risk of being infected with HIV. If you choose to begin PrEP, you should first be tested for   HIV. You should then be tested every 3 months for as long as you are taking PrEP.  PREGNANCY   If you are premenopausal and you may become pregnant, ask your health care provider about preconception counseling.  If you may become pregnant, take 400 to 800 micrograms (mcg) of folic acid every day.  If you want to prevent pregnancy, talk to your health care provider about birth control (contraception). OSTEOPOROSIS AND MENOPAUSE   Osteoporosis is a disease in which the bones lose minerals and strength with aging. This can result in serious bone fractures. Your risk for osteoporosis can be identified using a bone density scan.  If you are 65 years of age or older, or if you are at risk for osteoporosis and fractures, ask your health care provider if you should be screened.  Ask your health care provider whether you should take a calcium or vitamin D supplement to lower your risk for osteoporosis.  Menopause may have certain physical symptoms and risks.  Hormone replacement therapy may reduce some of these symptoms and risks. Talk to your health care provider about whether hormone replacement therapy is right for you.  HOME CARE INSTRUCTIONS   Schedule regular health, dental, and eye exams.  Stay current with your immunizations.   Do not use any tobacco products including cigarettes, chewing tobacco, or electronic cigarettes.  If you are pregnant, do not drink alcohol.  If you are breastfeeding, limit how much and how often you drink alcohol.  Limit alcohol intake to no more than 1  drink per day for nonpregnant women. One drink equals 12 ounces of beer, 5 ounces of wine, or 1 ounces of hard liquor.  Do not use street drugs.  Do not share needles.  Ask your health care provider for help if you need support or information about quitting drugs.  Tell your health care provider if you often feel depressed.  Tell your health care provider if you have ever been abused or do not feel safe at home.   This information is not intended to replace advice given to you by your health care provider. Make sure you discuss any questions you have with your health care provider.   Document Released: 09/20/2010 Document Revised: 03/28/2014 Document Reviewed: 02/06/2013 Elsevier Interactive Patient Education 2016 [employer]  

## 2015-05-29 NOTE — Assessment & Plan Note (Signed)
Immunizations are up to date, exercises regularly and non-smoker. We talked about some health concerns about [facility] and checking lipid panel today. Other recent labs reviewed with the patient at the visit. Given screening recommendations.

## 2015-06-01 ENCOUNTER — Encounter: Payer: Self-pay | Admitting: Internal Medicine

## 2015-06-02 MED ORDER — ALPRAZOLAM 0.5 MG PO TABS: 0.5000 mg | ORAL_TABLET | Freq: Every day | ORAL | Status: DC | PRN

## 2016-01-26 ENCOUNTER — Encounter: Payer: Self-pay | Admitting: Internal Medicine

## 2016-01-26 MED ORDER — ALPRAZOLAM 0.5 MG PO TABS: 0.5000 mg | ORAL_TABLET | Freq: Every day | ORAL | 2 refills | Status: DC | PRN

## 2016-03-24 DIAGNOSIS — Z20828 Contact with and (suspected) exposure to other viral communicable diseases: Secondary | ICD-10-CM | POA: Diagnosis not present

## 2016-03-24 DIAGNOSIS — R509 Fever, unspecified: Secondary | ICD-10-CM | POA: Diagnosis not present

## 2016-05-05 ENCOUNTER — Encounter: Payer: Self-pay | Admitting: Internal Medicine

## 2016-05-05 ENCOUNTER — Ambulatory Visit (INDEPENDENT_AMBULATORY_CARE_PROVIDER_SITE_OTHER): Payer: 59 | Admitting: Internal Medicine

## 2016-05-05 ENCOUNTER — Other Ambulatory Visit (INDEPENDENT_AMBULATORY_CARE_PROVIDER_SITE_OTHER): Payer: 59

## 2016-05-05 VITALS — BP 110/64 | HR 76 | Temp 98.2°F | Ht 70.0 in | Wt 156.0 lb

## 2016-05-05 DIAGNOSIS — E038 Other specified hypothyroidism: Secondary | ICD-10-CM | POA: Diagnosis not present

## 2016-05-05 DIAGNOSIS — Z0000 Encounter for general adult medical examination without abnormal findings: Secondary | ICD-10-CM

## 2016-05-05 DIAGNOSIS — E063 Autoimmune thyroiditis: Secondary | ICD-10-CM | POA: Diagnosis not present

## 2016-05-05 DIAGNOSIS — J4520 Mild intermittent asthma, uncomplicated: Secondary | ICD-10-CM

## 2016-05-05 LAB — COMPREHENSIVE METABOLIC PANEL
ALT: 30 U/L (ref 0–35)
AST: 27 U/L (ref 0–37)
Albumin: 4.2 g/dL (ref 3.5–5.2)
Alkaline Phosphatase: 42 U/L (ref 39–117)
BUN: 13 mg/dL (ref 6–23)
CO2: 28 mEq/L (ref 19–32)
Calcium: 9.1 mg/dL (ref 8.4–10.5)
Chloride: 107 mEq/L (ref 96–112)
Creatinine, Ser: 0.79 mg/dL (ref 0.40–1.20)
GFR: 85.57 mL/min (ref >60.00)
GLUCOSE: 65 mg/dL — AB (ref 70–99)
Potassium: 4.6 mEq/L (ref 3.5–5.1)
Sodium: 139 mEq/L (ref 135–145)
Total Bilirubin: 0.4 mg/dL (ref 0.2–1.2)
Total Protein: 6.8 g/dL (ref 6.0–8.3)

## 2016-05-05 LAB — CBC
HEMATOCRIT: 40.5 % (ref 36.0–46.0)
Hemoglobin: 13.7 g/dL (ref 12.0–15.0)
MCHC: 33.7 g/dL (ref 30.0–36.0)
MCV: 90.4 fl (ref 78.0–100.0)
PLATELETS: 273 10*3/uL (ref 150.0–400.0)
RBC: 4.49 Mil/uL (ref 3.87–5.11)
RDW: 12.4 % (ref 11.5–15.5)
WBC: 4.7 10*3/uL (ref 4.0–10.5)

## 2016-05-05 LAB — LIPID PANEL
CHOL/HDL RATIO: 2
Cholesterol: 167 mg/dL (ref 0–200)
HDL: 73 mg/dL (ref >39.00)
LDL Cholesterol: 77 mg/dL (ref 0–99)
NONHDL: 93.72
Triglycerides: 82 mg/dL (ref 0.0–149.0)
VLDL: 16.4 mg/dL (ref 0.0–40.0)

## 2016-05-05 NOTE — Assessment & Plan Note (Signed)
Seeing endo and will continue to follow with them. Post hashimoto's.

## 2016-05-05 NOTE — Assessment & Plan Note (Signed)
Flu and tetanus up to date. Pap smear up to date and getting mammogram with gyn later this month. Counseled on sun safety and dangers of distracted driving. Given screening recommendations and information on stress.

## 2016-05-05 NOTE — Progress Notes (Signed)
   Subjective:    Patient ID: Kayla Wagner, female    DOB: Jul 13, 1975, 41 y.o.   MRN: [id number]  HPI The patient is a 41 YO female coming in for wellness. Still non-smoker and exercising some. Getting over the flu. A lot of stressful events this year and she is finally starting to recover from that.    PMH, [facility], social history reviewed and updated.   Review of Systems  Constitutional: Negative.   HENT: Negative.   Eyes: Negative.   Respiratory: Negative for cough, chest tightness and shortness of breath.   Cardiovascular: Negative for chest pain, palpitations and leg swelling.  Gastrointestinal: Negative for abdominal distention, abdominal pain, constipation, diarrhea, nausea and vomiting.  Musculoskeletal: Negative.   Skin: Negative.   Neurological: Negative.   Psychiatric/Behavioral: Negative.       Objective:   Physical Exam  Constitutional: She is oriented to person, place, and time. She appears well-developed and well-nourished.  HENT:  Head: Normocephalic and atraumatic.  Eyes: EOM are normal.  Neck: Normal range of motion.  Cardiovascular: Normal rate and regular rhythm.   Pulmonary/Chest: Effort normal and breath sounds normal. No respiratory distress. She has no wheezes. She has no rales.  Abdominal: Soft. Bowel sounds are normal. She exhibits no distension. There is no tenderness. There is no rebound.  Musculoskeletal: She exhibits no edema.  Neurological: She is alert and oriented to person, place, and time. Coordination normal.  Skin: Skin is warm and dry.  Psychiatric: She has a normal mood and affect.   Vitals:   05/05/16 0858  BP: 110/64  Pulse: 76  Temp: 98.2 F (36.8 C)  TempSrc: Oral  SpO2: 100%  Weight: 156 lb (70.8 kg)  Height: 5\' 10"  (1.778 m)      Assessment & Plan:

## 2016-05-05 NOTE — Assessment & Plan Note (Signed)
Doing well, no flare and took tamiflu during recent flu to help minimize symptoms.

## 2016-05-05 NOTE — Patient Instructions (Signed)
We will check the labs today and send the results on mychart.    Stress and Stress Management Stress is a normal reaction to life events. It is what you feel when life demands more than you are used to or more than you can handle. Some stress can be useful. For example, the stress reaction can help you catch the last bus of the day, study for a test, or meet a deadline at work. But stress that occurs too often or for too long can cause problems. It can affect your emotional health and interfere with relationships and normal daily activities. Too much stress can weaken your immune system and increase your risk for physical illness. If you already have a medical problem, stress can make it worse. What are the causes? All sorts of life events may cause stress. An event that causes stress for one person may not be stressful for another person. Major life events commonly cause stress. These may be positive or negative. Examples include losing your job, moving into a new home, getting married, having a baby, or losing a loved one. Less obvious life events may also cause stress, especially if they occur day after day or in combination. Examples include working long hours, driving in traffic, caring for children, being in debt, or being in a difficult relationship. What are the signs or symptoms? Stress may cause emotional symptoms including, the following:  Anxiety. This is feeling worried, afraid, on edge, overwhelmed, or out of control.  Anger. This is feeling irritated or impatient.  Depression. This is feeling sad, down, helpless, or guilty.  Difficulty focusing, remembering, or making decisions. Stress may cause physical symptoms, including the following:  Aches and pains. These may affect your head, neck, back, stomach, or other areas of your body.  Tight muscles or clenched jaw.  Low energy or trouble sleeping. Stress may cause unhealthy behaviors, including the following:  Eating to feel  better (overeating) or skipping meals.  Sleeping too little, too much, or both.  Working too much or putting off tasks (procrastination).  Smoking, drinking alcohol, or using drugs to feel better. How is this diagnosed? Stress is diagnosed through an assessment by your health care provider. Your health care provider will ask questions about your symptoms and any stressful life events.Your health care provider will also ask about your medical history and may order blood tests or other tests. Certain medical conditions and medicine can cause physical symptoms similar to stress. Mental illness can cause emotional symptoms and unhealthy behaviors similar to stress. Your health care provider may refer you to a mental health professional for further evaluation. How is this treated? Stress management is the recommended treatment for stress.The goals of stress management are reducing stressful life events and coping with stress in healthy ways. Techniques for reducing stressful life events include the following:  Stress identification. Self-monitor for stress and identify what causes stress for you. These skills may help you to avoid some stressful events.  Time management. Set your priorities, keep a calendar of events, and learn to say "no." These tools can help you avoid making too many commitments. Techniques for coping with stress include the following:  Rethinking the problem. Try to think realistically about stressful events rather than ignoring them or overreacting. Try to find the positives in a stressful situation rather than focusing on the negatives.  Exercise. Physical exercise can release both physical and emotional tension. The key is to find a form of exercise you enjoy and  do it regularly.  Relaxation techniques. These relax the body and mind. Examples include yoga, meditation, tai chi, biofeedback, deep breathing, progressive muscle relaxation, listening to music, being out in  nature, journaling, and other hobbies. Again, the key is to find one or more that you enjoy and can do regularly.  Healthy lifestyle. Eat a balanced diet, get plenty of sleep, and do not smoke. Avoid using alcohol or drugs to relax.  Strong support network. Spend time with family, friends, or other people you enjoy being around.Express your feelings and talk things over with someone you trust. Counseling or talktherapy with a mental health professional may be helpful if you are having difficulty managing stress on your own. Medicine is typically not recommended for the treatment of stress.Talk to your health care provider if you think you need medicine for symptoms of stress. Follow these instructions at home:  Keep all follow-up visits as directed by your health care provider.  Take all medicines as directed by your health care provider. Contact a health care provider if:  Your symptoms get worse or you start having new symptoms.  You feel overwhelmed by your problems and can no longer manage them on your own. Get help right away if:  You feel like hurting yourself or someone else. This information is not intended to replace advice given to you by your health care provider. Make sure you discuss any questions you have with your health care provider. Document Released: 08/31/2000 Document Revised: 08/13/2015 Document Reviewed: 10/30/2012 Elsevier Interactive Patient Education  2017 [place] Maintenance, Female Introduction Adopting a healthy lifestyle and getting preventive care can go a long way to promote health and wellness. Talk with your health care provider about what schedule of regular examinations is right for you. This is a good chance for you to check in with your provider about disease prevention and staying healthy. In between checkups, there are plenty of things you can do on your own. Experts have done a lot of research about which lifestyle changes and  preventive measures are most likely to keep you healthy. Ask your health care provider for more information. Weight and diet Eat a healthy diet  Be sure to include plenty of vegetables, fruits, low-fat dairy products, and lean protein.  Do not eat a lot of foods high in solid fats, added sugars, or salt.  Get regular exercise. This is one of the most important things you can do for your health.  Most adults should exercise for at least 150 minutes each week. The exercise should increase your heart rate and make you sweat (moderate-intensity exercise).  Most adults should also do strengthening exercises at least twice a week. This is in addition to the moderate-intensity exercise. Maintain a healthy weight  Body mass index (BMI) is a measurement that can be used to identify possible weight problems. It estimates body fat based on height and weight. Your health care provider can help determine your BMI and help you achieve or maintain a healthy weight.  For females 22 years of age and older:  A BMI below 18.5 is considered underweight.  A BMI of 18.5 to 24.9 is normal.  A BMI of 25 to 29.9 is considered overweight.  A BMI of 30 and above is considered obese. Watch levels of cholesterol and blood lipids  You should start having your blood tested for lipids and cholesterol at 41 years of age, then have this test every 5 years.  You may need  to have your cholesterol levels checked more often if:  Your lipid or cholesterol levels are high.  You are older than 41 years of age.  You are at high risk for heart disease. Cancer screening Lung Cancer  Lung cancer screening is recommended for adults 76-61 years old who are at high risk for lung cancer because of a history of smoking.  A yearly low-dose CT scan of the lungs is recommended for people who:  Currently smoke.  Have quit within the past 15 years.  Have at least a 30-pack-year history of smoking. A pack year is smoking an  average of one pack of cigarettes a day for 1 year.  Yearly screening should continue until it has been 15 years since you quit.  Yearly screening should stop if you develop a health problem that would prevent you from having lung cancer treatment. Breast Cancer  Practice breast self-awareness. This means understanding how your breasts normally appear and feel.  It also means doing regular breast self-exams. Let your health care provider know about any changes, no matter how small.  If you are in your 20s or 30s, you should have a clinical breast exam (CBE) by a health care provider every 1-3 years as part of a regular health exam.  If you are 21 or older, have a CBE every year. Also consider having a breast X-ray (mammogram) every year.  If you have a family history of breast cancer, talk to your health care provider about genetic screening.  If you are at high risk for breast cancer, talk to your health care provider about having an MRI and a mammogram every year.  Breast cancer gene (BRCA) assessment is recommended for women who have family members with BRCA-related cancers. BRCA-related cancers include:  Breast.  Ovarian.  Tubal.  Peritoneal cancers.  Results of the assessment will determine the need for genetic counseling and BRCA1 and BRCA2 testing. Cervical Cancer  Your health care provider may recommend that you be screened regularly for cancer of the pelvic organs (ovaries, uterus, and vagina). This screening involves a pelvic examination, including checking for microscopic changes to the surface of your cervix (Pap test). You may be encouraged to have this screening done every 3 years, beginning at age 13.  For women ages 94-65, health care providers may recommend pelvic exams and Pap testing every 3 years, or they may recommend the Pap and pelvic exam, combined with testing for human papilloma virus (HPV), every 5 years. Some types of HPV increase your risk of cervical  cancer. Testing for HPV may also be done on women of any age with unclear Pap test results.  Other health care providers may not recommend any screening for nonpregnant women who are considered low risk for pelvic cancer and who do not have symptoms. Ask your health care provider if a screening pelvic exam is right for you.  If you have had past treatment for cervical cancer or a condition that could lead to cancer, you need Pap tests and screening for cancer for at least 20 years after your treatment. If Pap tests have been discontinued, your risk factors (such as having a new sexual partner) need to be reassessed to determine if screening should resume. Some women have medical problems that increase the chance of getting cervical cancer. In these cases, your health care provider may recommend more frequent screening and Pap tests. Colorectal Cancer  This type of cancer can be detected and often prevented.  Routine colorectal  cancer screening usually begins at 41 years of age and continues through 41 years of age.  Your health care provider may recommend screening at an earlier age if you have risk factors for colon cancer.  Your health care provider may also recommend using home test kits to check for hidden blood in the stool.  A small camera at the end of a tube can be used to examine your colon directly (sigmoidoscopy or colonoscopy). This is done to check for the earliest forms of colorectal cancer.  Routine screening usually begins at age 40.  Direct examination of the colon should be repeated every 5-10 years through 41 years of age. However, you may need to be screened more often if early forms of precancerous polyps or small growths are found. Skin Cancer  Check your skin from head to toe regularly.  Tell your health care provider about any new moles or changes in moles, especially if there is a change in a mole's shape or color.  Also tell your health care provider if you have a  mole that is larger than the size of a pencil eraser.  Always use sunscreen. Apply sunscreen liberally and repeatedly throughout the day.  Protect yourself by wearing long sleeves, pants, a wide-brimmed hat, and sunglasses whenever you are outside. Heart disease, diabetes, and high blood pressure  High blood pressure causes heart disease and increases the risk of stroke. High blood pressure is more likely to develop in:  People who have blood pressure in the high end of the normal range (130-139/85-89 mm Hg).  People who are overweight or obese.  People who are African American.  If you are 65-54 years of age, have your blood pressure checked every 3-5 years. If you are 8 years of age or older, have your blood pressure checked every year. You should have your blood pressure measured twice-once when you are at a hospital or clinic, and once when you are not at a hospital or clinic. Record the average of the two measurements. To check your blood pressure when you are not at a hospital or clinic, you can use:  An automated blood pressure machine at a pharmacy.  A home blood pressure monitor.  If you are between 25 years and 61 years old, ask your health care provider if you should take aspirin to prevent strokes.  Have regular diabetes screenings. This involves taking a blood sample to check your fasting blood sugar level.  If you are at a normal weight and have a low risk for diabetes, have this test once every three years after 41 years of age.  If you are overweight and have a high risk for diabetes, consider being tested at a younger age or more often. Preventing infection Hepatitis B  If you have a higher risk for hepatitis B, you should be screened for this virus. You are considered at high risk for hepatitis B if:  You were born in a country where hepatitis B is common. Ask your health care provider which countries are considered high risk.  Your parents were born in a  high-risk country, and you have not been immunized against hepatitis B (hepatitis B vaccine).  You have HIV or AIDS.  You use needles to inject street drugs.  You live with someone who has hepatitis B.  You have had sex with someone who has hepatitis B.  You get hemodialysis treatment.  You take certain medicines for conditions, including cancer, organ transplantation, and autoimmune conditions.  Hepatitis C  Blood testing is recommended for:  Everyone born from [birth year] through 1965.  Anyone with known risk factors for hepatitis C. Sexually transmitted infections (STIs)  You should be screened for sexually transmitted infections (STIs) including gonorrhea and chlamydia if:  You are sexually active and are younger than 41 years of age.  You are older than 41 years of age and your health care provider tells you that you are at risk for this type of infection.  Your sexual activity has changed since you were last screened and you are at an increased risk for chlamydia or gonorrhea. Ask your health care provider if you are at risk.  If you do not have HIV, but are at risk, it may be recommended that you take a prescription medicine daily to prevent HIV infection. This is called pre-exposure prophylaxis (PrEP). You are considered at risk if:  You are sexually active and do not regularly use condoms or know the HIV status of your partner(s).  You take drugs by injection.  You are sexually active with a partner who has HIV. Talk with your health care provider about whether you are at high risk of being infected with HIV. If you choose to begin PrEP, you should first be tested for HIV. You should then be tested every 3 months for as long as you are taking PrEP. Pregnancy  If you are premenopausal and you may become pregnant, ask your health care provider about preconception counseling.  If you may become pregnant, take 400 to 800 micrograms (mcg) of folic acid every day.  If you want  to prevent pregnancy, talk to your health care provider about birth control (contraception). Osteoporosis and menopause  Osteoporosis is a disease in which the bones lose minerals and strength with aging. This can result in serious bone fractures. Your risk for osteoporosis can be identified using a bone density scan.  If you are 65 years of age or older, or if you are at risk for osteoporosis and fractures, ask your health care provider if you should be screened.  Ask your health care provider whether you should take a calcium or vitamin D supplement to lower your risk for osteoporosis.  Menopause may have certain physical symptoms and risks.  Hormone replacement therapy may reduce some of these symptoms and risks. Talk to your health care provider about whether hormone replacement therapy is right for you. Follow these instructions at home:  Schedule regular health, dental, and eye exams.  Stay current with your immunizations.  Do not use any tobacco products including cigarettes, chewing tobacco, or electronic cigarettes.  If you are pregnant, do not drink alcohol.  If you are breastfeeding, limit how much and how often you drink alcohol.  Limit alcohol intake to no more than 1 drink per day for nonpregnant women. One drink equals 12 ounces of beer, 5 ounces of wine, or 1 ounces of hard liquor.  Do not use street drugs.  Do not share needles.  Ask your health care provider for help if you need support or information about quitting drugs.  Tell your health care provider if you often feel depressed.  Tell your health care provider if you have ever been abused or do not feel safe at home. This information is not intended to replace advice given to you by your health care provider. Make sure you discuss any questions you have with your health care provider. Document Released: 09/20/2010 Document Revised: 08/13/2015 Document Reviewed: 12/09/2014  2017  [employer]vier

## 2016-05-05 NOTE — Progress Notes (Signed)
Pre visit review using our clinic review tool, if applicable. No additional management support is needed unless otherwise documented below in the visit note. 

## 2016-05-23 DIAGNOSIS — Z124 Encounter for screening for malignant neoplasm of cervix: Secondary | ICD-10-CM | POA: Diagnosis not present

## 2016-05-23 DIAGNOSIS — Z1231 Encounter for screening mammogram for malignant neoplasm of breast: Secondary | ICD-10-CM | POA: Diagnosis not present

## 2016-05-23 LAB — HM PAP SMEAR

## 2016-06-17 DIAGNOSIS — B354 Tinea corporis: Secondary | ICD-10-CM | POA: Diagnosis not present

## 2016-06-23 DIAGNOSIS — E038 Other specified hypothyroidism: Secondary | ICD-10-CM | POA: Diagnosis not present

## 2016-06-23 DIAGNOSIS — E063 Autoimmune thyroiditis: Secondary | ICD-10-CM | POA: Diagnosis not present

## 2016-06-27 DIAGNOSIS — E038 Other specified hypothyroidism: Secondary | ICD-10-CM | POA: Diagnosis not present

## 2016-06-27 DIAGNOSIS — E063 Autoimmune thyroiditis: Secondary | ICD-10-CM | POA: Diagnosis not present

## 2016-07-14 DIAGNOSIS — B354 Tinea corporis: Secondary | ICD-10-CM | POA: Diagnosis not present

## 2016-08-22 DIAGNOSIS — E063 Autoimmune thyroiditis: Secondary | ICD-10-CM | POA: Diagnosis not present

## 2016-08-22 DIAGNOSIS — E038 Other specified hypothyroidism: Secondary | ICD-10-CM | POA: Diagnosis not present

## 2016-08-29 DIAGNOSIS — E038 Other specified hypothyroidism: Secondary | ICD-10-CM | POA: Diagnosis not present

## 2016-08-29 DIAGNOSIS — E063 Autoimmune thyroiditis: Secondary | ICD-10-CM | POA: Diagnosis not present

## 2016-11-22 DIAGNOSIS — D225 Melanocytic nevi of trunk: Secondary | ICD-10-CM | POA: Diagnosis not present

## 2016-11-22 DIAGNOSIS — D1801 Hemangioma of skin and subcutaneous tissue: Secondary | ICD-10-CM | POA: Diagnosis not present

## 2016-11-22 DIAGNOSIS — L814 Other melanin hyperpigmentation: Secondary | ICD-10-CM | POA: Diagnosis not present

## 2016-12-21 ENCOUNTER — Encounter: Payer: Self-pay | Admitting: Internal Medicine

## 2016-12-21 ENCOUNTER — Ambulatory Visit (INDEPENDENT_AMBULATORY_CARE_PROVIDER_SITE_OTHER): Payer: 59 | Admitting: Internal Medicine

## 2016-12-21 VITALS — BP 108/70 | HR 62 | Temp 98.2°F | Ht 70.0 in | Wt 147.0 lb

## 2016-12-21 DIAGNOSIS — F4322 Adjustment disorder with anxiety: Secondary | ICD-10-CM

## 2016-12-21 DIAGNOSIS — Z23 Encounter for immunization: Secondary | ICD-10-CM | POA: Diagnosis not present

## 2016-12-21 MED ORDER — BUSPIRONE HCL 5 MG PO TABS: 5.0000 mg | ORAL_TABLET | Freq: Two times a day (BID) | ORAL | 6 refills | Status: AC | PRN

## 2016-12-21 NOTE — Patient Instructions (Signed)
We have sent in the buspar to use as needed for the anxiety. You are doing a lot of good things to help so keep those up.

## 2016-12-23 DIAGNOSIS — F4322 Adjustment disorder with anxiety: Secondary | ICD-10-CM | POA: Insufficient documentation

## 2016-12-23 NOTE — Assessment & Plan Note (Signed)
Rx for buspar prn. 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 Talked to her about if they could change some financial habits so she could take a lesser paying job which is better. Talked about self-care and she will do some more of that. She does not feel that she has time for counseling at this time.
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
## 2017-01-02 ENCOUNTER — Encounter: Payer: Self-pay | Admitting: Internal Medicine

## 2017-03-28 DIAGNOSIS — E038 Other specified hypothyroidism: Secondary | ICD-10-CM | POA: Diagnosis not present

## 2017-03-28 DIAGNOSIS — E063 Autoimmune thyroiditis: Secondary | ICD-10-CM | POA: Diagnosis not present

## 2017-03-28 LAB — TSH: TSH: 1.3 (ref 0.41–5.90)

## 2017-04-14 ENCOUNTER — Encounter: Payer: Self-pay | Admitting: Internal Medicine

## 2017-04-14 ENCOUNTER — Ambulatory Visit: Payer: 59 | Admitting: Internal Medicine

## 2017-04-14 DIAGNOSIS — K1379 Other lesions of oral mucosa: Secondary | ICD-10-CM | POA: Diagnosis not present

## 2017-04-14 DIAGNOSIS — F5101 Primary insomnia: Secondary | ICD-10-CM | POA: Diagnosis not present

## 2017-04-14 DIAGNOSIS — E038 Other specified hypothyroidism: Secondary | ICD-10-CM | POA: Diagnosis not present

## 2017-04-14 DIAGNOSIS — J0110 Acute frontal sinusitis, unspecified: Secondary | ICD-10-CM

## 2017-04-14 DIAGNOSIS — G4700 Insomnia, unspecified: Secondary | ICD-10-CM | POA: Insufficient documentation

## 2017-04-14 DIAGNOSIS — J329 Chronic sinusitis, unspecified: Secondary | ICD-10-CM | POA: Insufficient documentation

## 2017-04-14 DIAGNOSIS — E063 Autoimmune thyroiditis: Secondary | ICD-10-CM | POA: Diagnosis not present

## 2017-04-14 MED ORDER — AMOXICILLIN-POT CLAVULANATE 875-125 MG PO TABS: 1.0000 | ORAL_TABLET | Freq: Two times a day (BID) | ORAL | 0 refills | Status: DC

## 2017-04-14 MED ORDER — TRIAMCINOLONE ACETONIDE 0.1 % MT PSTE: 1.0000 "application " | PASTE | Freq: Two times a day (BID) | OROMUCOSAL | 12 refills | Status: AC

## 2017-04-14 MED ORDER — SUVOREXANT 15 MG PO TABS: 15.0000 mg | ORAL_TABLET | Freq: Every day | ORAL | 2 refills | Status: DC

## 2017-04-14 NOTE — Assessment & Plan Note (Signed)
Rx for augmentin given time course and symptoms. No indication for prednisone. Advised to start zyrtec otc.

## 2017-04-14 NOTE — Patient Instructions (Addendum)
We have sent in the mouth paste for the cancer sore.  We have sent in augmentin to take 1 pill twice a day for 10 days.  We have sent in belsomra for the sleep which should help you to stay asleep through the night.

## 2017-04-14 NOTE — Progress Notes (Signed)
   Subjective:    Patient ID: Kayla Wagner, female    DOB: March 11, 1976, 42 y.o.   MRN: [id number]  HPI The patient is a 42 YO female coming in for several concerns including sinus problems (started 2-3 weeks ago with cold symptoms, cough and drainage initially, now cough is better, drainage less, congestion and sinus pain more, some jaw pain and ear pain on the left, she has taken tylenol and ibuprofen for it with partial relief, yellow nose drainage, some chills but no fevers, overall worsening), sleeping problems (able to fall asleep well most of the time, wakes up around 1-2 am and cannot get back to sleep, less stress in her life, starts thinking about logistics and events etc, does take benadryl sometimes but still wakes in the middle of the night, denies snoring), and mouth sore (came out of nowhere, does not remember biting cheek, pain with eating or drinking, started 1-2 days ago, overall worsening, tried salt water rinse without relief)  Review of Systems  Constitutional: Positive for activity change, appetite change and chills. Negative for fatigue, fever and unexpected weight change.  HENT: Positive for congestion, ear pain, postnasal drip, rhinorrhea, sinus pressure, sinus pain, sneezing and voice change. Negative for ear discharge, sore throat, tinnitus and trouble swallowing.   Eyes: Negative.   Respiratory: Positive for cough. Negative for chest tightness, shortness of breath and wheezing.   Cardiovascular: Negative.   Gastrointestinal: Negative.   Musculoskeletal: Negative for arthralgias, back pain and myalgias.  Neurological: Negative.   Psychiatric/Behavioral: Positive for sleep disturbance. Negative for behavioral problems, decreased concentration, dysphoric mood and suicidal ideas. The patient is nervous/anxious.       Objective:   Physical Exam  Constitutional: She is oriented to person, place, and time. She appears well-developed and well-nourished.  HENT:  Head:  Normocephalic and atraumatic.  Oropharynx with redness and drainage, nose with swollen turbinates, TMs bulging on the left, right normal, frontal sinus pressure  Eyes: EOM are normal.  Neck: Normal range of motion. No thyromegaly present.  Cardiovascular: Normal rate and regular rhythm.  Pulmonary/Chest: Effort normal and breath sounds normal. No respiratory distress. She has no wheezes. She has no rales.  Abdominal: Soft.  Musculoskeletal: She exhibits tenderness.  Lymphadenopathy:    She has cervical adenopathy.  Neurological: She is alert and oriented to person, place, and time.  Skin: Skin is warm and dry.  Psychiatric: She has a normal mood and affect.   Vitals:   04/14/17 1605  BP: 92/60  Pulse: (!) 56  Temp: 97.9 F (36.6 C)  TempSrc: Oral  SpO2: 98%  Weight: 148 lb (67.1 kg)  Height: 5\' 10"  (1.778 m)      Assessment & Plan:

## 2017-04-14 NOTE — Assessment & Plan Note (Signed)
Rx for belsomra to help with night time awakenings.

## 2017-04-14 NOTE — Assessment & Plan Note (Signed)
Rx for triamcinolone mouth paste and encouraged to use ice for pain if needed.

## 2017-04-17 DIAGNOSIS — L700 Acne vulgaris: Secondary | ICD-10-CM | POA: Diagnosis not present

## 2017-04-17 DIAGNOSIS — D1801 Hemangioma of skin and subcutaneous tissue: Secondary | ICD-10-CM | POA: Diagnosis not present

## 2017-04-17 DIAGNOSIS — L853 Xerosis cutis: Secondary | ICD-10-CM | POA: Diagnosis not present

## 2017-04-18 ENCOUNTER — Encounter: Payer: Self-pay | Admitting: Internal Medicine

## 2017-04-18 LAB — T4, FREE

## 2017-04-19 ENCOUNTER — Telehealth: Payer: Self-pay | Admitting: Internal Medicine

## 2017-04-19 NOTE — Telephone Encounter (Signed)
Copied from [place]. Topic: Quick Communication - See Telephone Encounter >> Apr 19, 2017  3:38 PM Vernona Rieger wrote: CRM for notification. See Telephone encounter for:   04/19/17.   [facility] Suvorexant [facility]) 15 MG TABS  Call back [phone] Fax [phone]

## 2017-04-20 NOTE — Telephone Encounter (Signed)
Tried pulling patient up on cover my meds quite a few ways but system keeps stating cannot find matching patient with name and dob provided. Any other way to try and locate her for PA online?

## 2017-04-24 NOTE — Telephone Encounter (Signed)
LVM for patient to call back because I tried to do a PA on her Belsomra and was told that they have her ineligible in there system for her insurance. She needs to call her insurance and talk to a representative about her member services. (CRM made and can give patient the information)

## 2017-04-26 ENCOUNTER — Encounter: Payer: Self-pay | Admitting: Internal Medicine

## 2017-04-28 NOTE — Telephone Encounter (Signed)
PA done over the phone for Belsomra, PA was denied covered medications are eszopiclone tabs, zaleplon caps, zolpidem immediate release or extended release tabs, or zolpidem sublingual tablets. Please advise

## 2017-05-01 NOTE — Telephone Encounter (Signed)
Call patient and see if she can download a savings card from online at [employer]som[employer]'s website. The other medications her insurance allows are medications which can cause dependence and are sedating. She did not want to use one of these at her visit so I'm not sure I would recommend those to her.

## 2017-05-18 ENCOUNTER — Encounter: Payer: Self-pay | Admitting: Internal Medicine

## 2017-05-18 MED ORDER — RAMELTEON 8 MG PO TABS: 8.0000 mg | ORAL_TABLET | Freq: Every day | ORAL | 6 refills | Status: AC

## 2017-05-18 NOTE — Addendum Note (Signed)
Addended by: Pricilla Holm A on: 05/18/2017 03:50 PM   Modules accepted: Orders

## 2017-05-29 ENCOUNTER — Telehealth: Payer: Self-pay | Admitting: Internal Medicine

## 2017-05-29 NOTE — Telephone Encounter (Signed)
Will start PA for Rozerem this week but the Belsomra has already been done and was denied. The Rozerem is the replacement

## 2017-05-29 NOTE — Telephone Encounter (Signed)
Pharmacy called and and the pt needs a PA on both  ramelteon (ROZEREM) 8 MG tablet And  Suvorexant (BELSOMRA) 15 MG TABS

## 2017-05-31 ENCOUNTER — Telehealth: Payer: Self-pay | Admitting: Internal Medicine

## 2017-05-31 NOTE — Telephone Encounter (Signed)
Copied from [place] [id number]. Topic: Quick Communication - See Telephone Encounter >> May 31, 2017 12:30 PM Burnis Medin, NT wrote: CRM for notification. See Telephone encounter for: Kayla Wagner is calling to get PA for  ramelteon (ROZEREM) 8 MG tablet. [facility] uses [facility] [place] [place] [address] [phone] (Phone) [phone] (Fax)   05/31/17.

## 2017-06-01 DIAGNOSIS — Z01419 Encounter for gynecological examination (general) (routine) without abnormal findings: Secondary | ICD-10-CM | POA: Diagnosis not present

## 2017-06-01 DIAGNOSIS — Z124 Encounter for screening for malignant neoplasm of cervix: Secondary | ICD-10-CM | POA: Diagnosis not present

## 2017-06-01 NOTE — Telephone Encounter (Signed)
Approved Start Date:05/02/2017;Coverage End Date:06/01/2018 Pharmacy informed states it went through for 100 dollars

## 2017-06-01 NOTE — Telephone Encounter (Signed)
Called christ back he called stating that there pharmacy has been having trouble for months sending faxes to our office and was just making sure that we know the rozerem needed a PA. I will start PA this week

## 2017-06-12 DIAGNOSIS — N925 Other specified irregular menstruation: Secondary | ICD-10-CM | POA: Diagnosis not present

## 2017-06-14 ENCOUNTER — Encounter: Payer: Self-pay | Admitting: Internal Medicine

## 2017-06-21 DIAGNOSIS — M9902 Segmental and somatic dysfunction of thoracic region: Secondary | ICD-10-CM | POA: Diagnosis not present

## 2017-06-21 DIAGNOSIS — M542 Cervicalgia: Secondary | ICD-10-CM | POA: Diagnosis not present

## 2017-06-21 DIAGNOSIS — M9901 Segmental and somatic dysfunction of cervical region: Secondary | ICD-10-CM | POA: Diagnosis not present

## 2017-06-22 DIAGNOSIS — M9901 Segmental and somatic dysfunction of cervical region: Secondary | ICD-10-CM | POA: Diagnosis not present

## 2017-06-22 DIAGNOSIS — M9902 Segmental and somatic dysfunction of thoracic region: Secondary | ICD-10-CM | POA: Diagnosis not present

## 2017-06-22 DIAGNOSIS — M542 Cervicalgia: Secondary | ICD-10-CM | POA: Diagnosis not present

## 2017-06-24 DIAGNOSIS — M9902 Segmental and somatic dysfunction of thoracic region: Secondary | ICD-10-CM | POA: Diagnosis not present

## 2017-06-24 DIAGNOSIS — M542 Cervicalgia: Secondary | ICD-10-CM | POA: Diagnosis not present

## 2017-06-24 DIAGNOSIS — M9901 Segmental and somatic dysfunction of cervical region: Secondary | ICD-10-CM | POA: Diagnosis not present

## 2017-07-04 DIAGNOSIS — M9902 Segmental and somatic dysfunction of thoracic region: Secondary | ICD-10-CM | POA: Diagnosis not present

## 2017-07-04 DIAGNOSIS — M9901 Segmental and somatic dysfunction of cervical region: Secondary | ICD-10-CM | POA: Diagnosis not present

## 2017-07-04 DIAGNOSIS — M542 Cervicalgia: Secondary | ICD-10-CM | POA: Diagnosis not present

## 2017-07-05 DIAGNOSIS — M542 Cervicalgia: Secondary | ICD-10-CM | POA: Diagnosis not present

## 2017-07-05 DIAGNOSIS — M9902 Segmental and somatic dysfunction of thoracic region: Secondary | ICD-10-CM | POA: Diagnosis not present

## 2017-07-05 DIAGNOSIS — M9901 Segmental and somatic dysfunction of cervical region: Secondary | ICD-10-CM | POA: Diagnosis not present

## 2017-07-07 DIAGNOSIS — M9902 Segmental and somatic dysfunction of thoracic region: Secondary | ICD-10-CM | POA: Diagnosis not present

## 2017-07-07 DIAGNOSIS — M542 Cervicalgia: Secondary | ICD-10-CM | POA: Diagnosis not present

## 2017-07-07 DIAGNOSIS — M9901 Segmental and somatic dysfunction of cervical region: Secondary | ICD-10-CM | POA: Diagnosis not present

## 2017-07-10 DIAGNOSIS — M9902 Segmental and somatic dysfunction of thoracic region: Secondary | ICD-10-CM | POA: Diagnosis not present

## 2017-07-10 DIAGNOSIS — M9901 Segmental and somatic dysfunction of cervical region: Secondary | ICD-10-CM | POA: Diagnosis not present

## 2017-07-10 DIAGNOSIS — M542 Cervicalgia: Secondary | ICD-10-CM | POA: Diagnosis not present

## 2017-07-14 DIAGNOSIS — M9901 Segmental and somatic dysfunction of cervical region: Secondary | ICD-10-CM | POA: Diagnosis not present

## 2017-07-14 DIAGNOSIS — M9902 Segmental and somatic dysfunction of thoracic region: Secondary | ICD-10-CM | POA: Diagnosis not present

## 2017-07-14 DIAGNOSIS — M542 Cervicalgia: Secondary | ICD-10-CM | POA: Diagnosis not present

## 2017-07-17 DIAGNOSIS — M542 Cervicalgia: Secondary | ICD-10-CM | POA: Diagnosis not present

## 2017-07-17 DIAGNOSIS — M9902 Segmental and somatic dysfunction of thoracic region: Secondary | ICD-10-CM | POA: Diagnosis not present

## 2017-07-17 DIAGNOSIS — M9901 Segmental and somatic dysfunction of cervical region: Secondary | ICD-10-CM | POA: Diagnosis not present

## 2017-07-21 DIAGNOSIS — M9901 Segmental and somatic dysfunction of cervical region: Secondary | ICD-10-CM | POA: Diagnosis not present

## 2017-07-21 DIAGNOSIS — M542 Cervicalgia: Secondary | ICD-10-CM | POA: Diagnosis not present

## 2017-07-21 DIAGNOSIS — M9902 Segmental and somatic dysfunction of thoracic region: Secondary | ICD-10-CM | POA: Diagnosis not present

## 2017-07-24 DIAGNOSIS — M542 Cervicalgia: Secondary | ICD-10-CM | POA: Diagnosis not present

## 2017-07-24 DIAGNOSIS — M9902 Segmental and somatic dysfunction of thoracic region: Secondary | ICD-10-CM | POA: Diagnosis not present

## 2017-07-24 DIAGNOSIS — M9901 Segmental and somatic dysfunction of cervical region: Secondary | ICD-10-CM | POA: Diagnosis not present

## 2017-07-26 DIAGNOSIS — M9902 Segmental and somatic dysfunction of thoracic region: Secondary | ICD-10-CM | POA: Diagnosis not present

## 2017-07-26 DIAGNOSIS — M542 Cervicalgia: Secondary | ICD-10-CM | POA: Diagnosis not present

## 2017-07-26 DIAGNOSIS — M9901 Segmental and somatic dysfunction of cervical region: Secondary | ICD-10-CM | POA: Diagnosis not present

## 2018-01-17 DIAGNOSIS — H9203 Otalgia, bilateral: Secondary | ICD-10-CM | POA: Diagnosis not present

## 2018-01-25 ENCOUNTER — Ambulatory Visit: Payer: 59 | Admitting: Internal Medicine

## 2018-01-25 ENCOUNTER — Encounter: Payer: Self-pay | Admitting: Internal Medicine

## 2018-01-25 DIAGNOSIS — H9203 Otalgia, bilateral: Secondary | ICD-10-CM

## 2018-01-25 DIAGNOSIS — R300 Dysuria: Secondary | ICD-10-CM

## 2018-01-25 DIAGNOSIS — H9209 Otalgia, unspecified ear: Secondary | ICD-10-CM | POA: Insufficient documentation

## 2018-01-25 LAB — POCT URINALYSIS DIPSTICK
BILIRUBIN UA: NEGATIVE
Clarity, UA: NEGATIVE
Glucose, UA: NEGATIVE
KETONES UA: NEGATIVE
Leukocytes, UA: NEGATIVE
Nitrite, UA: NEGATIVE
PH UA: 7 (ref 5.0–8.0)
PROTEIN UA: NEGATIVE
RBC UA: NEGATIVE
SPEC GRAV UA: 1.015 (ref 1.010–1.025)
UROBILINOGEN UA: 0.2 U/dL

## 2018-01-25 MED ORDER — FLUCONAZOLE 150 MG PO TABS: 150.0000 mg | ORAL_TABLET | ORAL | 0 refills | Status: DC

## 2018-01-25 NOTE — Assessment & Plan Note (Signed)
S/P augmentin and ears both look clear. No further antibiotics needed but advised zyrtec over the counter to help with drainage.

## 2018-01-25 NOTE — Progress Notes (Signed)
   Subjective:    Patient ID: Kayla Wagner, female    DOB: February 02, 1976, 42 y.o.   MRN: [id number]  HPI The patient is a 42 YO female coming in for several concerns including ear pain (bilateral and started about 1 month ago, about 1 week ago did e-visit and got augmentin which she took for 1 week, she finished yesterday and the left ear is much improved, the right one is about 70% better but still some sore, she used to have pain in the chin area below and around both ears which is gone, still having some sinus drainage which started in the last week, denies fevers or chills, does have mild cough but no SOB, denies chest pains, no hearing changes), and urinary symptoms (going on for some time as well, took cranberry over the counter which helped for some time, taking the antibiotic caused her symptoms to worsen dramatically and started a yeast infection, she does have pain with urination as well as after, some blood in urine, feels crampy in low stomach, denies back pain, denies nausea or vomiting), and wants to know about kidney transplant (she has a relative needing a kidney and is contemplating this but she is not sure all what this entails and possible ramifications for the future).   Review of Systems  Constitutional: Negative.   HENT: Positive for congestion, ear pain and rhinorrhea. Negative for postnasal drip, sinus pain, sore throat, tinnitus, trouble swallowing and voice change.   Eyes: Negative.   Respiratory: Negative for cough, chest tightness and shortness of breath.   Cardiovascular: Negative for chest pain, palpitations and leg swelling.  Gastrointestinal: Negative for abdominal distention, abdominal pain, constipation, diarrhea, nausea and vomiting.  Genitourinary: Positive for dysuria, frequency and hematuria. Negative for decreased urine volume, difficulty urinating, enuresis, flank pain, genital sores, menstrual problem, urgency and vaginal bleeding.  Musculoskeletal:  Negative.   Skin: Negative.   Neurological: Negative.   Psychiatric/Behavioral: Negative.       Objective:   Physical Exam  Constitutional: She is oriented to person, place, and time. She appears well-developed and well-nourished.  HENT:  Head: Normocephalic and atraumatic.  TMs normal with slight bulge clear fluid bilaterally, oropharynx with redness no discharge, nose normal.   Eyes: EOM are normal.  Neck: Normal range of motion.  Cardiovascular: Normal rate and regular rhythm.  Pulmonary/Chest: Effort normal and breath sounds normal. No respiratory distress. She has no wheezes. She has no rales.  Abdominal: Soft. Bowel sounds are normal. She exhibits no distension. There is tenderness. There is no rebound.  Suprapubic tenderness  Musculoskeletal: She exhibits no edema.  Neurological: She is alert and oriented to person, place, and time. Coordination normal.  Skin: Skin is warm and dry.  Psychiatric: She has a normal mood and affect.   Vitals:   01/25/18 0815  BP: 90/60  Pulse: 67  Temp: 98 F (36.7 C)  TempSrc: Oral  SpO2: 99%  Weight: 154 lb (69.9 kg)  Height: 5\' 10"  (1.778 m)      Assessment & Plan:  Visit time 25 minutes: greater than 50% of that time was spent in face to face counseling and coordination of care with the patient: counseled about process of kidney transplant and possible ramifications, as well diagnostic with symptoms of ear and bladder problems

## 2018-01-25 NOTE — Patient Instructions (Signed)
We have sent in diflucan to take today and then a second pill to take Sunday if needed.   We would recommend zyrtec (cetirizine) to help the ears get 100%.

## 2018-01-25 NOTE — Assessment & Plan Note (Addendum)
Checking POC U/A in the office negative for signs of bacterial infection so will treat for yeast infection with fluconazole.

## 2018-01-30 ENCOUNTER — Encounter: Payer: Self-pay | Admitting: Internal Medicine

## 2018-01-30 MED ORDER — FLUCONAZOLE 150 MG PO TABS: 150.0000 mg | ORAL_TABLET | ORAL | 0 refills | Status: AC

## 2018-02-13 DIAGNOSIS — N926 Irregular menstruation, unspecified: Secondary | ICD-10-CM | POA: Diagnosis not present

## 2018-02-13 DIAGNOSIS — R102 Pelvic and perineal pain: Secondary | ICD-10-CM | POA: Diagnosis not present

## 2018-05-01 DIAGNOSIS — E038 Other specified hypothyroidism: Secondary | ICD-10-CM | POA: Diagnosis not present

## 2018-05-01 DIAGNOSIS — E063 Autoimmune thyroiditis: Secondary | ICD-10-CM | POA: Diagnosis not present

## 2018-05-03 DIAGNOSIS — E038 Other specified hypothyroidism: Secondary | ICD-10-CM | POA: Diagnosis not present

## 2018-05-03 DIAGNOSIS — E063 Autoimmune thyroiditis: Secondary | ICD-10-CM | POA: Diagnosis not present

## 2018-05-16 ENCOUNTER — Encounter: Payer: 59 | Admitting: Internal Medicine

## 2018-05-17 ENCOUNTER — Encounter: Payer: 59 | Admitting: Internal Medicine

## 2018-05-24 ENCOUNTER — Encounter: Payer: 59 | Admitting: Internal Medicine

## 2018-07-03 ENCOUNTER — Encounter: Payer: Self-pay | Admitting: Internal Medicine

## 2018-07-05 ENCOUNTER — Ambulatory Visit (INDEPENDENT_AMBULATORY_CARE_PROVIDER_SITE_OTHER): Payer: 59 | Admitting: Internal Medicine

## 2018-07-05 DIAGNOSIS — F4322 Adjustment disorder with anxiety: Secondary | ICD-10-CM

## 2018-07-05 DIAGNOSIS — Z7189 Other specified counseling: Secondary | ICD-10-CM

## 2018-07-05 MED ORDER — ALPRAZOLAM 0.5 MG PO TABS: 0.5000 mg | ORAL_TABLET | Freq: Every day | ORAL | 2 refills | Status: DC | PRN

## 2018-07-05 NOTE — Progress Notes (Signed)
Virtual Visit via Video Note  I connected with Kayla Wagner on 07/05/18 at  2:20 PM EDT by a video enabled telemedicine application and verified that I am speaking with the correct person using two identifiers.   I discussed the limitations of evaluation and management by telemedicine and the availability of in person appointments. The patient expressed understanding and agreed to proceed.  History of Present Illness: The patient is a 43 y.o. female with visit for anxiety. Started with the outbreak of coronavirus. She was doing very well before that and not taking any meds. Has been trying to limit her news and she is walking outside daily which does help some. Denies SI/HI. She is struggling with some panic episodes. Overall it is worsening the longer the outbreak goes on. Has tried relaxation exercises and daily exercise which does help some. Is coping well overall. Has tried xanax before rarely and would like to have a refill of this for current circumstances.  Observations/Objective: Appearance: normal, breathing appears normal, normal grooming, abdomen does not appear distended, throat normal, memory normal, mental status is A and O times 3  Assessment and Plan: See problem oriented charting  Follow Up Instructions: Rx for xanax  Visit time 25 minutes: greater than 50% of that time was spent in face to face counseling and coordination of care with the patient: counseled about coronavirus and precautions as well as her likely case of this back in January after travel to [place], as well as her anxiety and various coping skills  I discussed the assessment and treatment plan with the patient. The patient was provided an opportunity to ask questions and all were answered. The patient agreed with the plan and demonstrated an understanding of the instructions.   The patient was advised to call back or seek an in-person evaluation if the symptoms worsen or if the condition fails to improve as  anticipated.  Hoyt Koch, MD

## 2018-07-06 ENCOUNTER — Encounter: Payer: Self-pay | Admitting: Internal Medicine

## 2018-07-06 DIAGNOSIS — Z7189 Other specified counseling: Secondary | ICD-10-CM | POA: Insufficient documentation

## 2018-07-06 NOTE — Assessment & Plan Note (Signed)
Rx for xanax temporary. She has used buspar in the past with some relief but not working well now. She is doing exercise and we talked about other coping skills too.

## 2018-07-06 NOTE — Assessment & Plan Note (Signed)
Counseled extensively about coronavirus and ways to avoid exposure and social distancing.

## 2018-08-28 ENCOUNTER — Encounter: Payer: Self-pay | Admitting: Internal Medicine

## 2018-08-28 ENCOUNTER — Ambulatory Visit (INDEPENDENT_AMBULATORY_CARE_PROVIDER_SITE_OTHER): Payer: 59 | Admitting: Internal Medicine

## 2018-08-28 DIAGNOSIS — R21 Rash and other nonspecific skin eruption: Secondary | ICD-10-CM | POA: Diagnosis not present

## 2018-08-28 MED ORDER — DOXYCYCLINE HYCLATE 100 MG PO TABS: 100.0000 mg | ORAL_TABLET | Freq: Two times a day (BID) | ORAL | 0 refills | Status: AC

## 2018-08-28 NOTE — Progress Notes (Signed)
Virtual Visit via Video Note  I connected with Kayla Wagner on 08/28/18 at 12:40 PM EDT by a video enabled telemedicine application and verified that I am speaking with the correct person using two identifiers.  The patient and the provider were at separate locations throughout the entire encounter.   I discussed the limitations of evaluation and management by telemedicine and the availability of in person appointments. The patient expressed understanding and agreed to proceed.  History of Present Illness: The patient is a 43 y.o. female with visit for rash. Started about 3 days ago and she had pulled out a tick. Not sure how long it was on but she walks dogs daily and there are a lot of ticks even in the house despite flea/tick treatment. Has no fevers but had a headache last night with some nausea/diarrhea. Denies sick contacts. Overall it is stable. Has tried nothing on the rash. There is some clearing around the center and then red rash surrounding  Observations/Objective: Appearance: normal, breathing appears normal, casual grooming, abdomen does not appear distended, throat normal, memory normal, mental status is A and O times 3, unable to maneuver camera to the rash.  Assessment and Plan: See problem oriented charting  Follow Up Instructions: rx doxycycline 100 mg BID 1 week  I discussed the assessment and treatment plan with the patient. The patient was provided an opportunity to ask questions and all were answered. The patient agreed with the plan and demonstrated an understanding of the instructions.   The patient was advised to call back or seek an in-person evaluation if the symptoms worsen or if the condition fails to improve as anticipated.  Hoyt Koch, MD

## 2018-08-28 NOTE — Assessment & Plan Note (Signed)
Advised cortisone cream otc. If not resolving start doxycycline to take for 1 week given known tick exposure.

## 2018-09-10 ENCOUNTER — Other Ambulatory Visit: Payer: Self-pay | Admitting: Obstetrics and Gynecology

## 2018-09-10 DIAGNOSIS — N643 Galactorrhea not associated with childbirth: Secondary | ICD-10-CM

## 2018-09-14 ENCOUNTER — Other Ambulatory Visit: Payer: Self-pay

## 2018-09-14 ENCOUNTER — Ambulatory Visit
Admission: RE | Admit: 2018-09-14 | Discharge: 2018-09-14 | Disposition: A | Discharge status: Home / Self Care | Payer: 59 | Source: Ambulatory Visit | Attending: Obstetrics and Gynecology | Admitting: Obstetrics and Gynecology

## 2018-09-14 DIAGNOSIS — N643 Galactorrhea not associated with childbirth: Secondary | ICD-10-CM

## 2018-09-18 ENCOUNTER — Other Ambulatory Visit: Payer: Self-pay | Admitting: Obstetrics and Gynecology

## 2018-09-18 DIAGNOSIS — N6452 Nipple discharge: Secondary | ICD-10-CM

## 2018-10-26 ENCOUNTER — Other Ambulatory Visit: Payer: 59

## 2018-11-05 ENCOUNTER — Encounter: Payer: Self-pay | Admitting: Internal Medicine

## 2018-11-12 MED ORDER — ALPRAZOLAM 0.5 MG PO TABS: 0.5000 mg | ORAL_TABLET | Freq: Every day | ORAL | 2 refills | Status: AC | PRN

## 2018-11-12 NOTE — Addendum Note (Signed)
Addended by: Hoyt Koch on: 11/12/2018 02:37 PM   Modules accepted: Orders

## 2018-11-14 ENCOUNTER — Other Ambulatory Visit: Payer: Self-pay

## 2018-11-14 ENCOUNTER — Ambulatory Visit
Admission: RE | Admit: 2018-11-14 | Discharge: 2018-11-14 | Disposition: A | Discharge status: Home / Self Care | Payer: 59 | Source: Ambulatory Visit | Attending: Obstetrics and Gynecology | Admitting: Obstetrics and Gynecology

## 2018-11-14 DIAGNOSIS — N6452 Nipple discharge: Secondary | ICD-10-CM

## 2018-11-14 MED ORDER — GADOBUTROL 1 MMOL/ML IV SOLN
6.0000 mL | Freq: Once | INTRAVENOUS | Status: AC | PRN
  Administered 2018-11-14: 6 mL via INTRAVENOUS

## 2018-12-21 ENCOUNTER — Encounter: Payer: Self-pay | Admitting: Internal Medicine

## 2018-12-24 MED ORDER — ALBUTEROL SULFATE HFA 108 (90 BASE) MCG/ACT IN AERS: 1.0000 | INHALATION_SPRAY | Freq: Four times a day (QID) | RESPIRATORY_TRACT | 1 refills | Status: AC | PRN

## 2020-04-13 IMAGING — MG DIGITAL DIAGNOSTIC BILATERAL MAMMOGRAM WITH TOMO AND CAD
6 of 10 series · 6 of 30 positions shown · non-contrast
Comparison: 06/01/2017 and earlier

CLINICAL DATA: The patient reports spontaneous and nonspontaneous
clear nipple discharge from the RIGHT breast for 6 months. The
patient was unable to have this evaluated earlier, secondary to
4ovid-NM. She reports pain across the LOWER portion of the RIGHT
breast.

EXAM:
DIGITAL DIAGNOSTIC BILATERAL MAMMOGRAM WITH CAD AND TOMO
ULTRASOUND RIGHT BREAST

[R CC synth-2D]
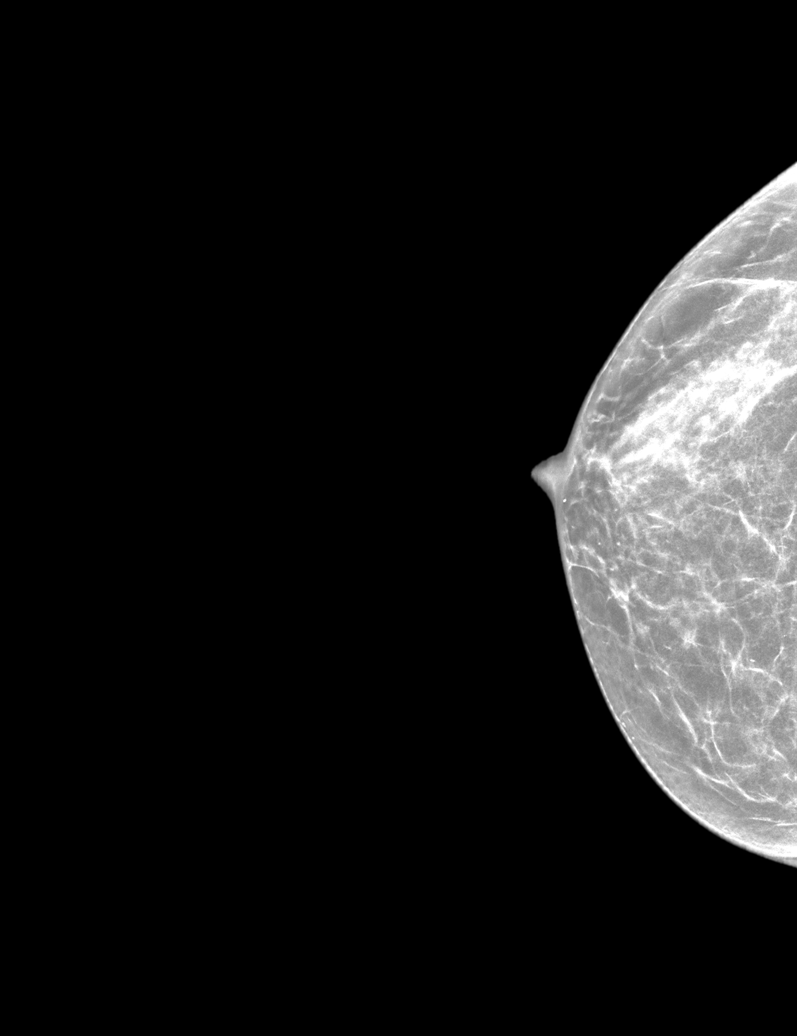

[L CC synth-2D]
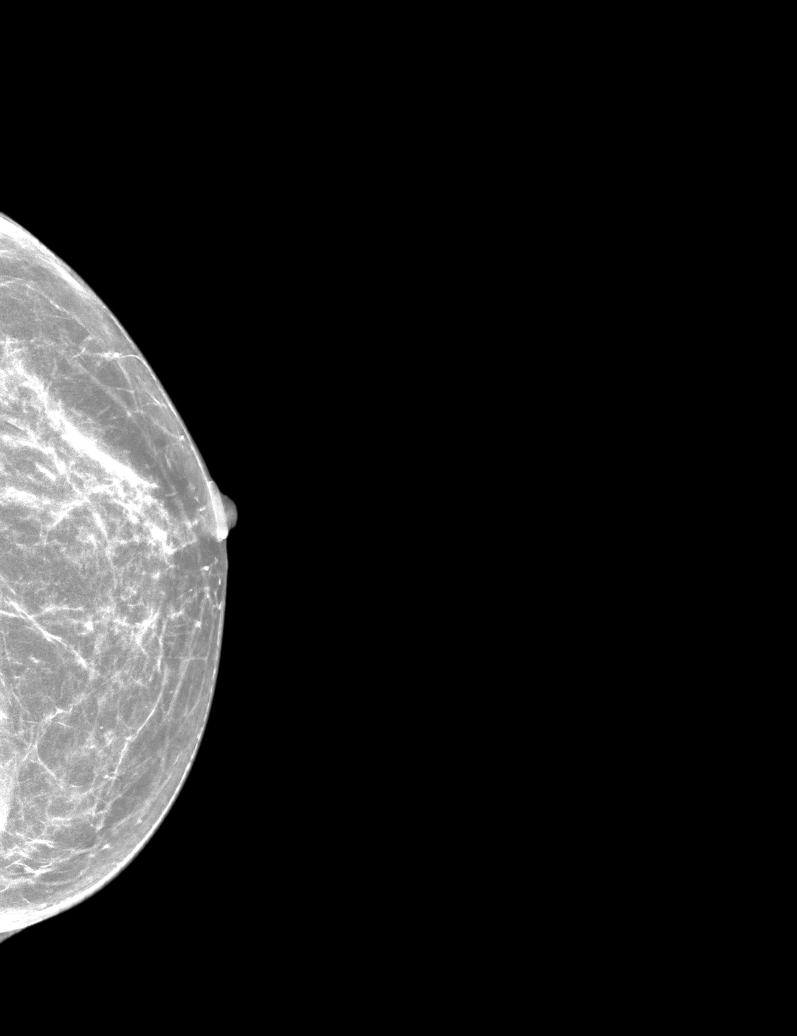

[L MLO synth-2D]
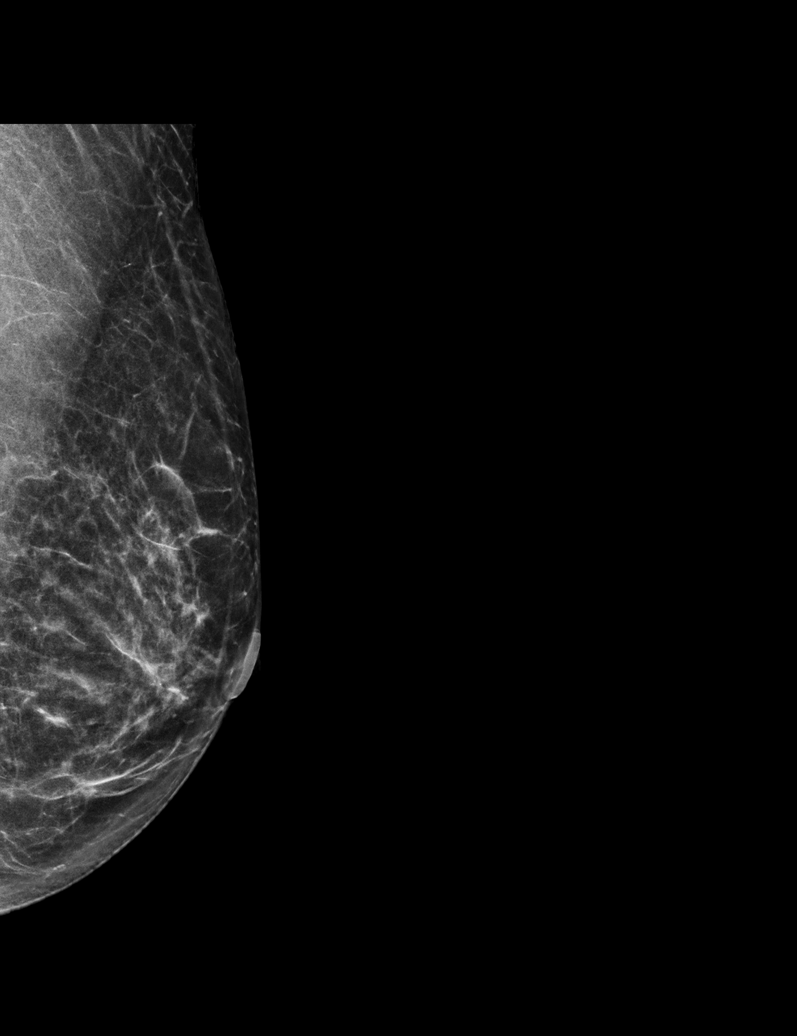

[R MLO synth-2D (1 of 2)]
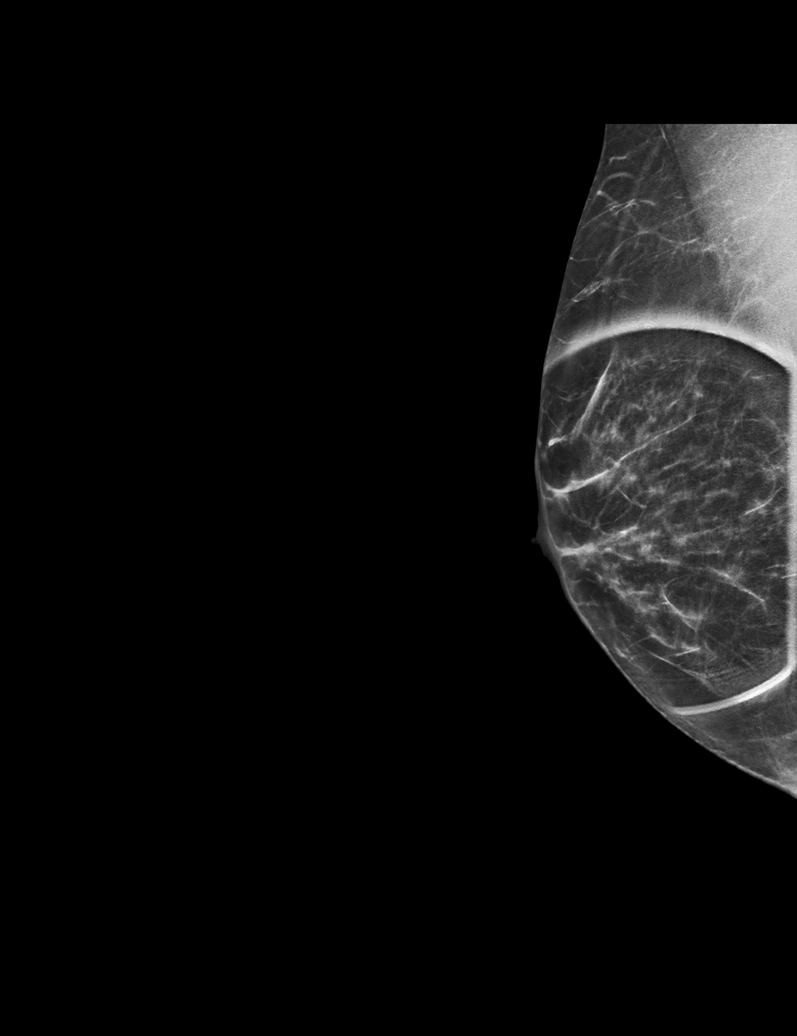

[R MLO synth-2D (2 of 2)]
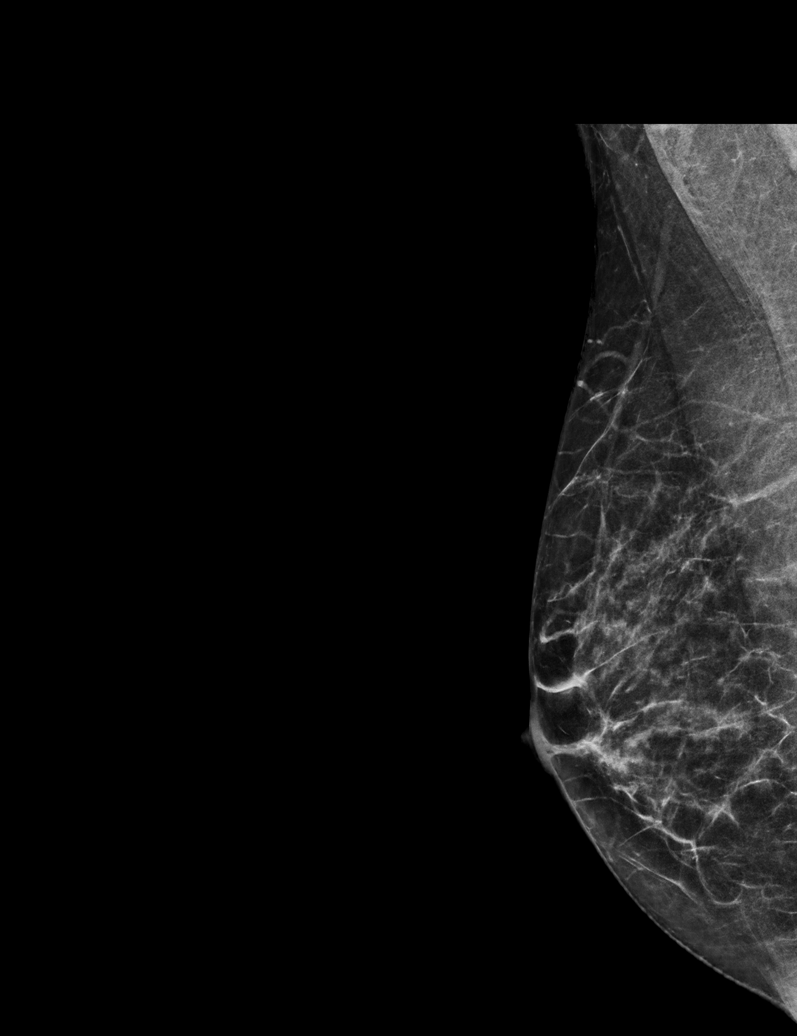

[R MLO tomo · tomo slice 29/57.0]
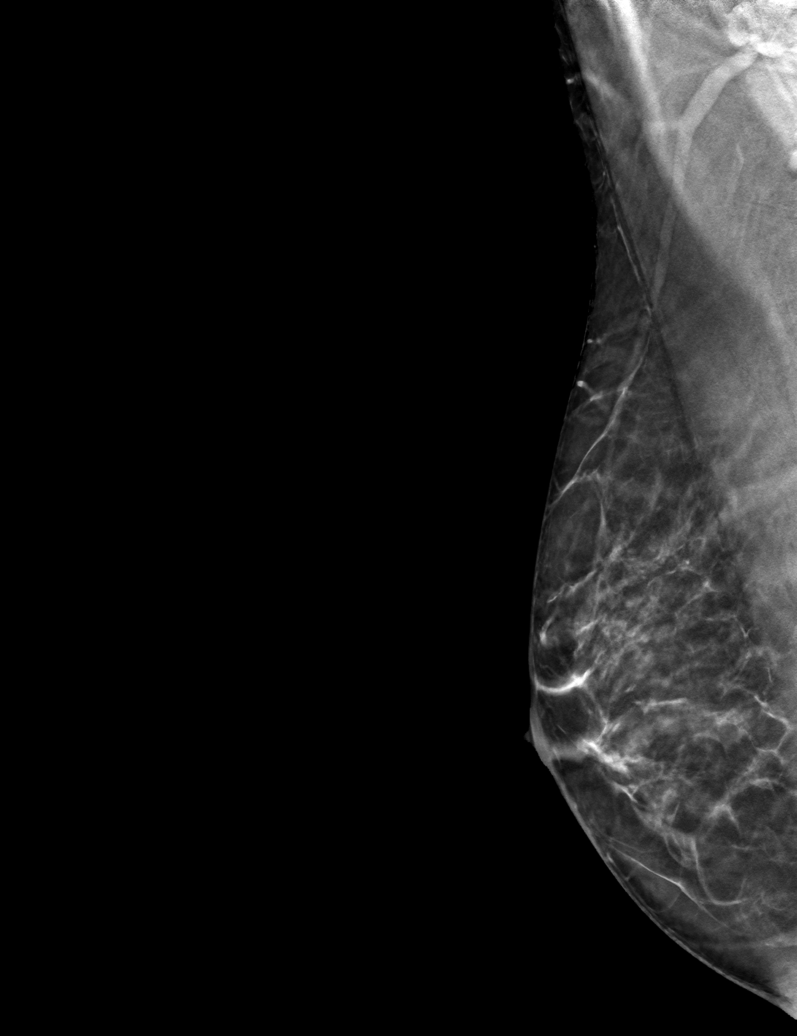

[6 of 30 positions shown; findings below may reference images not displayed]

ACR Breast Density Category c: The breast tissue is heterogeneously
dense, which may obscure small masses.
FINDINGS: No suspicious mass, distortion, or microcalcifications are
identified to suggest presence of malignancy. Spot tangential view
in the area concern in the retroareolar region of the RIGHT breast
demonstrates normal appearing fibroglandular tissue.

Mammographic images were processed with CAD.

On physical exam, the appearance of the RIGHT nipple is normal.
There is no asymmetry compared to the LEFT. There is no erythema or
skin change. I am able to express a small amount of clear discharge
from the central aspect of the RIGHT nipple, single duct.

Targeted ultrasound is performed, showing normal appearing
fibroglandular tissue in the retroareolar region of the RIGHT
breast. No suspicious mass, distortion, or acoustic shadowing is
demonstrated with ultrasound.
IMPRESSION: 1. No mammographic or sonographic evidence for malignancy.
2. Spontaneous single duct RIGHT nipple discharge for 6 months,
warranting further evaluation.

RECOMMENDATION:
Recommend breast MRI with and without contrast.

Recommend surgical consultation to discuss nipple discharge and
possible duct excision.

I have discussed the findings and recommendations with the patient.
Results were also provided in writing at the conclusion of the
visit. If applicable, a reminder letter will be sent to the patient
regarding the next appointment.

BI-RADS CATEGORY  1: Negative.
